# Patient Record
Sex: Male | Born: 1985 | Race: White | Hispanic: No | Marital: Single
Health system: Southern US, Community
[De-identification: ages and names within clinical notes are randomized; demographics above are authoritative.]

## PROBLEM LIST (undated history)

## (undated) DIAGNOSIS — R569 Unspecified convulsions: Secondary | ICD-10-CM

## (undated) DIAGNOSIS — F1910 Other psychoactive substance abuse, uncomplicated: Secondary | ICD-10-CM

---

## 2019-06-23 ENCOUNTER — Encounter: Payer: Managed Care, Other (non HMO) | Attending: Physician Assistant | Admitting: Physician Assistant

## 2019-06-23 ENCOUNTER — Other Ambulatory Visit: Payer: Self-pay

## 2019-06-23 DIAGNOSIS — E1140 Type 2 diabetes mellitus with diabetic neuropathy, unspecified: Secondary | ICD-10-CM | POA: Insufficient documentation

## 2019-06-23 DIAGNOSIS — I10 Essential (primary) hypertension: Secondary | ICD-10-CM | POA: Diagnosis not present

## 2019-06-23 DIAGNOSIS — Z86718 Personal history of other venous thrombosis and embolism: Secondary | ICD-10-CM | POA: Diagnosis not present

## 2019-06-23 DIAGNOSIS — L97522 Non-pressure chronic ulcer of other part of left foot with fat layer exposed: Secondary | ICD-10-CM | POA: Diagnosis not present

## 2019-06-23 DIAGNOSIS — E11621 Type 2 diabetes mellitus with foot ulcer: Secondary | ICD-10-CM | POA: Insufficient documentation

## 2019-06-23 DIAGNOSIS — Z7901 Long term (current) use of anticoagulants: Secondary | ICD-10-CM | POA: Insufficient documentation

## 2019-06-23 DIAGNOSIS — L97512 Non-pressure chronic ulcer of other part of right foot with fat layer exposed: Secondary | ICD-10-CM | POA: Insufficient documentation

## 2019-06-23 DIAGNOSIS — F17200 Nicotine dependence, unspecified, uncomplicated: Secondary | ICD-10-CM | POA: Diagnosis not present

## 2019-06-23 NOTE — Progress Notes (Signed)
Bleiler, ISENBERG ([id number]) Visit Report for 06/23/2019 Abuse/Suicide Risk Screen Details Patient Name: Harold Garcia, Harold Garcia Date of Service: 06/23/2019 2:15 PM Medical Record Number: [id number] Patient Account Number: [id number] Date of Birth/Sex: 1985-03-06 (34 y.o. M) Treating RN: Curtis Sites Primary Care Daisey Caloca: SYSTEM, PCP Other Clinician: Referring Fabian Coca: Elray Buba Treating Irys Nigh/Extender: STONE III, HOYT Weeks in Treatment: 0 Abuse/Suicide Risk Screen Items Answer ABUSE RISK SCREEN: Has anyone close to you tried to hurt or harm you recentlyo No Do you feel uncomfortable with anyone in your familyo No Has anyone forced you do things that you didnot want to doo No Electronic Signature(s) Signed: 06/23/2019 4:19:43 PM By: Curtis Sites Entered By: Curtis Sites on 06/23/2019 14:20:44 Harold Garcia ([id number]) -------------------------------------------------------------------------------- Activities of Daily Living Details Patient Name: Harold Garcia Date of Service: 06/23/2019 2:15 PM Medical Record Number: [id number] Patient Account Number: [id number] Date of Birth/Sex: December 13, 1985 (34 y.o. M) M) Treating RN: Curtis Sites Primary Care Chi Woodham: SYSTEM, PCP Other Clinician: Referring Alec Jaros: Elray Buba Treating Siona Coulston/Extender: STONE III, HOYT Weeks in Treatment: 0 Activities of Daily Living Items Answer Activities of Daily Living (Please select one for each item) Drive Automobile Completely Able Take Medications Completely Able Use Telephone Completely Able Care for Appearance Completely Able Use Toilet Completely Able Bath / Shower Completely Able Dress Self Completely Able Feed Self Completely Able Walk Completely Able Get In / Out Bed Completely Able Housework Completely Able Prepare Meals Completely Able Handle Money Completely Able Shop for Self Completely Able Electronic Signature(s) Signed: 06/23/2019 4:19:43 PM By: Curtis Sites Entered By: Curtis Sites on 06/23/2019 14:21:05 Harold Garcia ([id number]) -------------------------------------------------------------------------------- Education Screening Details Patient Name: Harold Garcia Date of Service: 06/23/2019 2:15 PM Medical Record Number: [id number] Patient Account Number: [id number] Date of Birth/Sex: 17-Jul-1985 (34 y.o. M) Treating RN: Curtis Sites Primary Care Jeralynn Vaquera: SYSTEM, PCP Other Clinician: Referring Dantre Yearwood: Elray Buba Treating Lenville Hibberd/Extender: Linwood Dibbles, HOYT Weeks in Treatment: 0 Primary Learner Assessed: Patient Learning Preferences/Education Level/Primary Language Learning Preference: Explanation, Demonstration Highest Education Level: College or Above Preferred Language: English Cognitive Barrier Language Barrier: No Translator Needed: No Memory Deficit: No Emotional Barrier: No Cultural/Religious Beliefs Affecting Medical Care: No Physical Barrier Impaired Vision: No Impaired Hearing: No Decreased Hand dexterity: No Knowledge/Comprehension Knowledge Level: Medium Comprehension Level: Medium Ability to understand written instructions: Medium Ability to understand verbal instructions: Medium Motivation Anxiety Level: Calm Cooperation: Cooperative Education Importance: Acknowledges Need Interest in Health Problems: Asks Questions Perception: Coherent Willingness to Engage in Self-Management Medium Activities: Readiness to Engage in Self-Management Medium Activities: Electronic Signature(s) Signed: 06/23/2019 4:19:43 PM By: Curtis Sites Entered By: Curtis Sites on 06/23/2019 14:21:25 Harold Garcia ([id number]) -------------------------------------------------------------------------------- Fall Risk Assessment Details Patient Name: Harold Garcia Date of Service: 06/23/2019 2:15 PM Medical Record Number: [id number] Patient Account Number: [id number] Date of Birth/Sex: 01-Jan-1986 (34 y.o.  M) Treating RN: Curtis Sites Primary Care Dawnna Gritz: SYSTEM, PCP Other Clinician: Referring Emalie Mcwethy: Elray Buba Treating Kevron Patella/Extender: STONE III, HOYT Weeks in Treatment: 0 Fall Risk Assessment Items Have you had 2 or more falls in the last 12 monthso 0 No Have you had any fall that resulted in injury in the last 12 monthso 0 No FALLS RISK SCREEN History of falling - immediate or within 3 months 0 No Secondary diagnosis (Do you have 2 or more medical diagnoseso) 0 No Ambulatory aid None/bed rest/wheelchair/nurse 0 Yes Crutches/cane/walker 0 No Furniture 0 No Intravenous therapy Access/Saline/Heparin Lock 0 No Gait/Transferring Normal/ bed rest/ wheelchair 0 Yes Weak (short steps with or without shuffle, stooped but able  to lift head while walking, may 0 No seek support from furniture) Impaired (short steps with shuffle, may have difficulty arising from chair, head down, impaired 0 No balance) Mental Status Oriented to own ability 0 Yes Electronic Signature(s) Signed: 06/23/2019 4:19:43 PM By: Montey Hora Entered By: Montey Hora on 06/23/2019 14:21:36 Harold Garcia ([id number]) -------------------------------------------------------------------------------- Nutrition Risk Screening Details Patient Name: Harold Garcia Date of Service: 06/23/2019 2:15 PM Medical Record Number: [id number] Patient Account Number: [id number] Date of Birth/Sex: 04/29/1985 (34 y.o. M) Treating RN: Montey Hora Primary Care Delayza Lungren: SYSTEM, PCP Other Clinician: Referring Jazlynne Milliner: Murlean Caller Treating Chet Greenley/Extender: STONE III, HOYT Weeks in Treatment: 0 Height (in): 75 Weight (lbs): 245 Body Mass Index (BMI): 30.6 Nutrition Risk Screening Items Score Screening NUTRITION RISK SCREEN: I have an illness or condition that made me change the kind and/or amount of food I eat 0 No I eat fewer than two meals per day 0 No I eat few fruits and vegetables, or milk products 0  No I have three or more drinks of beer, liquor or wine almost every day 0 No I have tooth or mouth problems that make it hard for me to eat 0 No I don't always have enough money to buy the food I need 0 No I eat alone most of the time 0 No I take three or more different prescribed or over-the-counter drugs a day 1 Yes Without wanting to, I have lost or gained 10 pounds in the last six months 0 No I am not always physically able to shop, cook and/or feed myself 0 No Nutrition Protocols Good Risk Protocol 0 No interventions needed Moderate Risk Protocol High Risk Proctocol Risk Level: Good Risk Score: 1 Electronic Signature(s) Signed: 06/23/2019 4:19:43 PM By: Montey Hora Entered By: Montey Hora on 06/23/2019 14:22:06

## 2019-06-23 NOTE — Progress Notes (Signed)
COREN, SAGAN ([id number]) Visit Report for 06/23/2019 Allergy List Details Patient Name: Harold Garcia, Harold Garcia Date of Service: 06/23/2019 2:15 PM Medical Record Number: [id number] Patient Account Number: [id number] Date of Birth/Sex: 05-21-1985 (34 y.o. M) Treating RN: Curtis Sites Primary Care Ronika Kelson: SYSTEM, PCP Other Clinician: Referring Danelly Hassinger: Elray Buba Treating Elany Felix/Extender: STONE III, HOYT Weeks in Treatment: 0 Allergies Active Allergies morphine Reaction: difficulty breathing Allergy Notes Electronic Signature(s) Signed: 06/23/2019 4:19:43 PM By: Curtis Sites Entered By: Curtis Sites on 06/23/2019 14:20:36 Harold Garcia ([id number]) -------------------------------------------------------------------------------- Arrival Information Details Patient Name: Harold Garcia Date of Service: 06/23/2019 2:15 PM Medical Record Number: [id number] Patient Account Number: [id number] Date of Birth/Sex: 06-13-1985 (33 y.o. M) Treating RN: Rodell Perna Primary Care Jacion Dismore: SYSTEM, PCP Other Clinician: Referring Martrice Apt: Elray Buba Treating Arshan Jabs/Extender: Linwood Dibbles, HOYT Weeks in Treatment: 0 Visit Information Patient Arrived: Ambulatory Arrival Time: 14:15 Accompanied By: self Transfer Assistance: None Patient Identification Verified: Yes Secondary Verification Process Completed: Yes Patient Has Alerts: Yes Patient Alerts: Patient on Blood Thinner Xarelto DMII Electronic Signature(s) Signed: 06/23/2019 4:19:43 PM By: Curtis Sites Entered By: Curtis Sites on 06/23/2019 14:23:05 Harold Garcia ([id number]) -------------------------------------------------------------------------------- Clinic Level of Care Assessment Details Patient Name: Harold Garcia Date of Service: 06/23/2019 2:15 PM Medical Record Number: [id number] Patient Account Number: [id number] Date of Birth/Sex: 1985-02-23 (33 y.o. M) Treating RN: Rodell Perna Primary Care Nezar Buckles:  SYSTEM, PCP Other Clinician: Referring Shardea Cwynar: Elray Buba Treating Ryker Pherigo/Extender: STONE III, HOYT Weeks in Treatment: 0 Clinic Level of Care Assessment Items TOOL 2 Quantity Score []  - Use when only an EandM is performed on the INITIAL visit 0 ASSESSMENTS - Nursing Assessment / Reassessment X - General Physical Exam (combine w/ comprehensive assessment (listed just below) when performed on new 1 20 pt. evals) X- 1 25 Comprehensive Assessment (HX, ROS, Risk Assessments, Wounds Hx, etc.) ASSESSMENTS - Wound and Skin Assessment / Reassessment []  - Simple Wound Assessment / Reassessment - one wound 0 X- 2 5 Complex Wound Assessment / Reassessment - multiple wounds []  - 0 Dermatologic / Skin Assessment (not related to wound area) ASSESSMENTS - Ostomy and/or Continence Assessment and Care []  - Incontinence Assessment and Management 0 []  - 0 Ostomy Care Assessment and Management (repouching, etc.) PROCESS - Coordination of Care X - Simple Patient / Family Education for ongoing care 1 15 []  - 0 Complex (extensive) Patient / Family Education for ongoing care X- 1 10 Staff obtains , Records, Test Results / Process Orders []  - 0 Staff telephones HHA, Nursing Homes / Clarify orders / etc []  - 0 Routine Transfer to another Facility (non-emergent condition) []  - 0 Routine Hospital Admission (non-emergent condition) X- 1 15 New Admissions / / Ordering NPWT, Apligraf, etc. []  - 0 Emergency Hospital Admission (emergent condition) X- 1 10 Simple Discharge Coordination []  - 0 Complex (extensive) Discharge Coordination PROCESS - Special Needs []  - Pediatric / Minor Patient Management 0 []  - 0 Isolation Patient Management []  - 0 Hearing / Language / Visual special needs []  - 0 Assessment of Community assistance (transportation, D/C planning, etc.) []  - 0 Additional assistance / Altered mentation []  - 0 Support Surface(s) Assessment (bed,  cushion, seat, etc.) INTERVENTIONS - Wound Cleansing / Measurement X - Wound Imaging (photographs - any number of wounds) 1 5 []  - 0 Wound Tracing (instead of photographs) X- 1 5 Simple Wound Measurement - one wound []  - 0 Complex Wound Measurement - multiple wounds Harold Garcia, Harold Garcia ( ) X- 1 5 Simple Wound Cleansing - one wound []  -  0 Complex Wound Cleansing - multiple wounds INTERVENTIONS - Wound Dressings []  - Small Wound Dressing one or multiple wounds 0 X- 1 15 Medium Wound Dressing one or multiple wounds []  - 0 Large Wound Dressing one or multiple wounds []  - 0 Application of Medications - injection INTERVENTIONS - Miscellaneous []  - External ear exam 0 []  - 0 Specimen Collection (cultures, biopsies, blood, body fluids, etc.) []  - 0 Specimen(s) / Culture(s) sent or taken to Lab for analysis []  - 0 Patient Transfer (multiple staff / / Similar devices) []  - 0 Simple Staple / Suture removal (25 or less) []  - 0 Complex Staple / Suture removal (26 or more) []  - 0 Hypo / Hyperglycemic Management (close monitor of Blood Glucose) []  - 0 Ankle / Brachial Index (ABI) - do not check if billed separately Has the patient been seen at the hospital within the last three years: Yes Total Score: 135 Level Of Care: New/Established - Level 4 Electronic Signature(s) Signed: 06/23/2019 3:08:17 PM By: Entered By: on 06/23/2019 15:02:01 ( ) -------------------------------------------------------------------------------- Encounter Discharge Information Details Patient Name: Nurse, adult Date of Service: 06/23/2019 2:15 PM Medical Record Number: Patient Account Number: Date of Birth/Sex: [birth year] (34 y.o. M) Treating RN: Rodell Perna Primary Care Maren Wiesen: SYSTEM, PCP Other Clinician: Referring Okla Qazi: Rodell Perna Treating Raliyah Montella/Extender: 08/23/2019, HOYT Weeks in Treatment:  0 Encounter Discharge Information Items Discharge Condition: Stable Ambulatory Status: Ambulatory Discharge Destination: Home Transportation: Private Auto Accompanied By: self Schedule Follow-up Appointment: Yes Clinical Summary of Care: Electronic Signature(s) Signed: 06/23/2019 3:08:17 PM By: [id number] Entered By: Harold Garcia on 06/23/2019 15:02:43 [id number] ([id number]) -------------------------------------------------------------------------------- Lower Extremity Assessment Details Patient Name: 11/25/1985 Date of Service: 06/23/2019 2:15 PM Medical Record Number: Rodell Perna Patient Account Number: Elray Buba Date of Birth/Sex: [birth year] (33 y.o. M) Treating RN: Rodell Perna Primary Care Ronnett Pullin: SYSTEM, PCP Other Clinician: Referring Tarisa Paola: Rodell Perna Treating Griselda Bramblett/Extender: STONE III, HOYT Weeks in Treatment: 0 Edema Assessment Assessed: [Left: No] [Right: No] Edema: [Left: No] [Right: No] Calf Left: Right: Point of Measurement: 36 cm From Medial Instep 42 cm 41 cm Ankle Left: Right: Point of Measurement: 12 cm From Medial Instep 23.5 cm 23.5 cm Vascular Assessment Pulses: Dorsalis Pedis Palpable: [Left:Yes] [Right:Yes] Doppler Audible: [Left:Yes] [Right:Yes] Posterior Tibial Palpable: [Left:Yes] [Right:Yes] Doppler Audible: [Left:Yes] [Right:Yes] Blood Pressure: Brachial: [Left:144] Dorsalis Pedis: 138 [Left:Dorsalis Pedis: 140] Ankle: Posterior Tibial: 152 [Left:Posterior Tibial: 150 1.06] [Right:1.04] Electronic Signature(s) Signed: 06/23/2019 4:19:43 PM By: Harold Garcia Entered By: [id number] on 06/23/2019 14:43:59 08/23/2019 ([id number]) -------------------------------------------------------------------------------- Multi Wound Chart Details Patient Name: [id number] Date of Service: 06/23/2019 2:15 PM Medical Record Number: [id number] Patient Account Number: Curtis Sites Date of Birth/Sex: 05-02-1985 (34 y.o.  M) Treating RN: Curtis Sites Primary Care Kristi Norment: SYSTEM, PCP Other Clinician: Referring Rianne Degraaf: Curtis Sites Treating Mari Battaglia/Extender: STONE III, HOYT Weeks in Treatment: 0 Vital Signs Height(in): 75 Pulse(bpm): 103 Weight(lbs): 245 Blood Pressure(mmHg): 144/78 Body Mass Index(BMI): 31 Temperature(F): 98.7 Respiratory Rate(breaths/min): 16 Photos: [N/A:N/A] Wound Location: Right Toe Second Left Toe Great N/A Wounding Event: Trauma Trauma N/A Primary Etiology: Diabetic Wound/Ulcer of the Lower Diabetic Wound/Ulcer of the Lower N/A Extremity Extremity Comorbid History: Deep Vein Thrombosis, Deep Vein Thrombosis, N/A Hypertension, Cirrhosis , Type II Hypertension, Cirrhosis , Type II Diabetes, Neuropathy Diabetes, Neuropathy Date Acquired: 05/19/2019 05/19/2019 N/A Weeks of Treatment: 0 0 N/A Wound Status: Open Open N/A Measurements L x W x D (cm) 0.2x0.5x0.1 0.6x1.7x0.1 N/A Area (cm) :  0.079 0.801 N/A Volume (cm) : 0.008 0.08 N/A Classification: Grade 1 Grade 1 N/A Exudate Amount: Medium Medium N/A Exudate Type: Serous Sanguinous N/A Exudate Color: amber red N/A Wound Margin: Flat and Intact Flat and Intact N/A Granulation Amount: Large (67-100%) Large (67-100%) N/A Granulation Quality: Pink Red N/A Necrotic Amount: Small (1-33%) None Present (0%) N/A Exposed Structures: Fat Layer (Subcutaneous Tissue) Fat Layer (Subcutaneous Tissue) N/A Exposed: Yes Exposed: Yes Fascia: No Fascia: No Tendon: No Tendon: No Muscle: No Muscle: No Joint: No Joint: No Bone: No Bone: No Epithelialization: None None N/A Treatment Notes Electronic Signature(s) Signed: 06/23/2019 3:08:17 PM By: Army Melia Entered By: Army Melia on 06/23/2019 15:00:02 Harold Garcia ([id number]) -------------------------------------------------------------------------------- Multi-Disciplinary Care Plan Details Patient Name: Harold Garcia Date of Service: 06/23/2019 2:15 PM Medical  Record Number: [id number] Patient Account Number: [id number] Date of Birth/Sex: Sep 27, 1985 (34 y.o. M) Treating RN: Army Melia Primary Care Cyril Woodmansee: SYSTEM, PCP Other Clinician: Referring Satya Bohall: Murlean Caller Treating Ashvik Grundman/Extender: STONE III, HOYT Weeks in Treatment: 0 Active Inactive Orientation to the Wound Care Program Nursing Diagnoses: Knowledge deficit related to the wound healing center program Goals: Patient/caregiver will verbalize understanding of the [place] Program Date Initiated: 06/23/2019 Target Resolution Date: 07/22/2019 Goal Status: Active Interventions: Provide education on orientation to the wound center Notes: Wound/Skin Impairment Nursing Diagnoses: Impaired tissue integrity Goals: Ulcer/skin breakdown will have a volume reduction of 30% by week 4 Date Initiated: 06/23/2019 Target Resolution Date: 07/20/2019 Goal Status: Active Interventions: Assess ulceration(s) every visit Notes: Electronic Signature(s) Signed: 06/23/2019 3:08:17 PM By: Army Melia Entered By: Army Melia on 06/23/2019 14:58:08 Harold Garcia ([id number]) -------------------------------------------------------------------------------- Pain Assessment Details Patient Name: Harold Garcia Date of Service: 06/23/2019 2:15 PM Medical Record Number: [id number] Patient Account Number: [id number] Date of Birth/Sex: 04-Jan-1986 (34 y.o. M) Treating RN: Army Melia Primary Care Shawn Carattini: SYSTEM, PCP Other Clinician: Referring Alexanderia Gorby: Murlean Caller Treating Zola Runion/Extender: STONE III, HOYT Weeks in Treatment: 0 Active Problems Location of Pain Severity and Description of Pain Patient Has Paino Yes Site Locations Rate the pain. Current Pain Level: 4 Pain Management and Medication Current Pain Management: Electronic Signature(s) Signed: 06/23/2019 3:08:17 PM By: Army Melia Signed: 06/23/2019 4:30:59 PM By: Lorine Bears RCP, RRT, CHT Entered By:  Lorine Bears on 06/23/2019 [place], Giordan ([id number]) -------------------------------------------------------------------------------- Patient/Caregiver Education Details Patient Name: Harold Garcia Date of Service: 06/23/2019 2:15 PM Medical Record Number: [id number] Patient Account Number: [id number] Date of Birth/Gender: [birth year] (33 y.o. M) Treating RN: Army Melia Primary Care Physician: SYSTEM, PCP Other Clinician: Referring Physician: Murlean Caller Treating Physician/Extender: Melburn Hake, HOYT Weeks in Treatment: 0 Education Assessment Education Provided To: Patient Education Topics Provided Wound/Skin Impairment: Handouts: Caring for Your Ulcer Methods: Demonstration, Explain/Verbal Responses: State content correctly Electronic Signature(s) Signed: 06/23/2019 3:08:17 PM By: Army Melia Entered By: Army Melia on 06/23/2019 15:02:11 Harold Garcia ([id number]) -------------------------------------------------------------------------------- Wound Assessment Details Patient Name: Harold Garcia Date of Service: 06/23/2019 2:15 PM Medical Record Number: [id number] Patient Account Number: [id number] Date of Birth/Sex: May 27, 1985 (33 y.o. M) Treating RN: Montey Hora Primary Care Mekayla Soman: SYSTEM, PCP Other Clinician: Referring Fanta Wimberley: Murlean Caller Treating Davine Coba/Extender: STONE III, HOYT Weeks in Treatment: 0 Wound Status Wound Number: 1 Primary Diabetic Wound/Ulcer of the Lower Extremity Etiology: Wound Location: Right Toe Second Wound Status: Open Wounding Event: Trauma Comorbid Deep Vein Thrombosis, Hypertension, Cirrhosis , Type II Date Acquired: 05/19/2019 History: Diabetes, Neuropathy Weeks Of Treatment: 0 Clustered Wound: No Photos Wound Measurements Length: (cm) 0.2 % Reduc Width: (cm) 0.5 %  Reduc Depth: (cm) 0.1 Epithel Area: (cm) 0.079 Tunnel Volume: (cm) 0.008 Underm tion in Area: tion in Volume: ialization:  None ing: No ining: No Wound Description Classification: Grade 1 Foul Od Wound Margin: Flat and Intact Slough/ Exudate Amount: Medium Exudate Type: Serous Exudate Color: amber or After Cleansing: No Fibrino Yes Wound Bed Granulation Amount: Large (67-100%) Exposed Structure Granulation Quality: Pink Fascia Exposed: No Necrotic Amount: Small (1-33%) Fat Layer (Subcutaneous Tissue) Exposed: Yes Necrotic Quality: Adherent Slough Tendon Exposed: No Muscle Exposed: No Joint Exposed: No Bone Exposed: No Treatment Notes Wound #1 (Right Toe Second) Notes xerform, coverlet Electronic Signature(s) Signed: 06/23/2019 4:19:43 PM By: Marcha Solders, Arlys John ([id number]) Entered By: Curtis Sites on 06/23/2019 14:34:48 Harold Garcia ([id number]) -------------------------------------------------------------------------------- Wound Assessment Details Patient Name: Harold Garcia Date of Service: 06/23/2019 2:15 PM Medical Record Number: [id number] Patient Account Number: [id number] Date of Birth/Sex: 1985/12/07 (34 y.o. M) Treating RN: Curtis Sites Primary Care Jeff Frieden: SYSTEM, PCP Other Clinician: Referring Malikah Lakey: Elray Buba Treating Hannah Crill/Extender: STONE III, HOYT Weeks in Treatment: 0 Wound Status Wound Number: 2 Primary Diabetic Wound/Ulcer of the Lower Extremity Etiology: Wound Location: Left Toe Great Wound Status: Open Wounding Event: Trauma Comorbid Deep Vein Thrombosis, Hypertension, Cirrhosis , Type II Date Acquired: 05/19/2019 History: Diabetes, Neuropathy Weeks Of Treatment: 0 Clustered Wound: No Photos Wound Measurements Length: (cm) 0.6 % R Width: (cm) 1.7 % R Depth: (cm) 0.1 Epi Area: (cm) 0.801 Tu Volume: (cm) 0.08 Un eduction in Area: eduction in Volume: thelialization: None nneling: No dermining: No Wound Description Classification: Grade 1 Fou Wound Margin: Flat and Intact Slo Exudate Amount: Medium Exudate Type:  Sanguinous Exudate Color: red l Odor After Cleansing: No ugh/Fibrino No Wound Bed Granulation Amount: Large (67-100%) Exposed Structure Granulation Quality: Red Fascia Exposed: No Necrotic Amount: None Present (0%) Fat Layer (Subcutaneous Tissue) Exposed: Yes Tendon Exposed: No Muscle Exposed: No Joint Exposed: No Bone Exposed: No Treatment Notes Wound #2 (Left Toe Great) Notes xerform, coverlet Electronic Signature(s) Signed: 06/23/2019 4:19:43 PM By: Marcha Solders, Arlys John ([id number]) Entered By: Curtis Sites on 06/23/2019 14:36:10 Harold Garcia ([id number]) -------------------------------------------------------------------------------- Vitals Details Patient Name: Harold Garcia Date of Service: 06/23/2019 2:15 PM Medical Record Number: [id number] Patient Account Number: [id number] Date of Birth/Sex: March 24, 1985 (34 y.o. M) Treating RN: Rodell Perna Primary Care Antwione Picotte: SYSTEM, PCP Other Clinician: Referring Zahriyah Joo: Elray Buba Treating Xavyer Steenson/Extender: STONE III, HOYT Weeks in Treatment: 0 Vital Signs Time Taken: 14:10 Temperature (F): 98.7 Height (in): 75 Pulse (bpm): 103 Source: Stated Respiratory Rate (breaths/min): 16 Weight (lbs): 245 Blood Pressure (mmHg): 144/78 Source: Measured Reference Range: 80 - 120 mg / dl Body Mass Index (BMI): 30.6 Electronic Signature(s) Signed: 06/23/2019 4:30:59 PM By: Dayton Martes RCP, RRT, CHT Entered By: Dayton Martes on 06/23/2019 14:16:54

## 2019-06-24 NOTE — Progress Notes (Signed)
SUMIT, BRANHAM ([id number]) Visit Report for 06/23/2019 Chief Complaint Document Details Patient Name: Harold Garcia, Harold Garcia Date of Service: 06/23/2019 2:15 PM Medical Record Number: [id number] Patient Account Number: [id number] Date of Birth/Sex: [birth year] (34 y.o. M) Treating RN: Rodell Perna Primary Care Provider: SYSTEM, PCP Other Clinician: Referring Provider: Elray Buba Treating Provider/Extender: Linwood Dibbles, Najai Waszak Weeks in Treatment: 0 Information Obtained from: Patient Chief Complaint Bilateral foot uclers Electronic Signature(s) Signed: 06/23/2019 2:53:54 PM By: Lenda Kelp PA-C Entered By: Lenda Kelp on 06/23/2019 14:53:54 Harold Garcia ([id number]) -------------------------------------------------------------------------------- HPI Details Patient Name: Harold Garcia Date of Service: 06/23/2019 2:15 PM Medical Record Number: [id number] Patient Account Number: [id number] Date of Birth/Sex: 11/02/1985 (33 y.o. M) Treating RN: Rodell Perna Primary Care Provider: SYSTEM, PCP Other Clinician: Referring Provider: Elray Buba Treating Provider/Extender: Linwood Dibbles, Terilynn Buresh Weeks in Treatment: 0 History of Present Illness HPI Description: 06/23/2019 upon evaluation today patient actually presents for initial inspection here in clinic concerning injuries that he has to his right second toe and left great toe. With that being said it appears in both cases that these were some type of injury. Fortunately there is no signs of infection at this time but nonetheless I do think we need to be very cautious of watching out for any evidence of this worsening into an infection. He has excellent blood flow and at this point the only thing really going badly for him is the fact that he is diabetic and does have a history of alcohol and drug abuse. He is currently at [facility]. Apparently he was or rather is from [place] and he was recently admitted for definitive treatment of his  alcohol, benzodiazepine, and opiate use disorders. Fortunately he seems to be doing well. He states that he is actually gained 10 pounds and that the food is "actually really good". He is a self-employed Administrator according to the note. He did have an hemoglobin A1c checked on 06/20/2019 and this was 8.6 although I do not have the actual report to show this it was performed at [facility] they would have the original report however it was not in the notes that we received for his appointment today. 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 He tells me that he pulled his toenails off as they were just "hanging there". Electronic Signature(s) Signed: 06/23/2019 3:09:46 PM By: Lenda Kelp PA-C Entered By: Lenda Kelp on 06/23/2019 15:09:46 MONTAVIS, SCHUBRING ([id number]) -------------------------------------------------------------------------------- Physical Exam Details Patient Name: Harold Garcia Date of Service: 06/23/2019 2:15 PM Medical Record Number: [id number] Patient Account Number: [id number] Date of Birth/Sex: 1986-01-10 (33 y.o. M) Treating RN: Rodell Perna Primary Care Provider: SYSTEM, PCP Other Clinician: Referring Provider: Elray Buba Treating Provider/Extender: STONE III, Roi Jafari Weeks in Treatment: 0 Constitutional patient is hypertensive.. pulse regular and within target range for patient.Marland Kitchen respirations regular, non-labored and within target range for patient.Marland Kitchen temperature within target range for patient.. Well-nourished and well-hydrated in no acute distress. Eyes conjunctiva clear no eyelid edema noted. pupils equal round and reactive to light and accommodation. Ears, Nose, Mouth, and Throat no gross abnormality of ear auricles or external auditory canals. normal hearing noted during conversation. mucus membranes moist. Respiratory normal breathing without  difficulty. Cardiovascular no clubbing, cyanosis, significant edema, <3 sec cap refill. Musculoskeletal normal gait and posture. no significant deformity or arthritic changes, no loss or range of motion, no clubbing. Psychiatric this patient is able  to make decisions and demonstrates good insight into disease process. Alert and Oriented x 3. pleasant and cooperative. Notes Upon inspection patient's wounds actually appear to be very minimal on the right second toe just below the toenail distally. This is almost completely healed. In regard to the left great toe this does appear to be a little bit more involved with regard to the nailbed where he pulled off the toenail but in general that is the only thing I really see going on at this point. There does not appear to be any signs of infection here and I really think is just going to take time for this to reepithelialize. He may grow toenail back at some point but that may take some time as well. Electronic Signature(s) Signed: 06/23/2019 3:10:29 PM By: Lenda Kelp PA-C Entered By: Lenda Kelp on 06/23/2019 15:10:29 Harold Garcia ([id number]) -------------------------------------------------------------------------------- Physician Orders Details Patient Name: Harold Garcia Date of Service: 06/23/2019 2:15 PM Medical Record Number: [id number] Patient Account Number: [id number] Date of Birth/Sex: 1985-09-19 (33 y.o. 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 o Cleanse wound with mild soap and water Wound #2 Left Toe Great o Clean wound with Normal Saline. o Cleanse wound with mild soap and water Primary Wound Dressing Wound #1 Right Toe Second o Xeroform Wound #2 Left Toe Great o Xeroform Secondary Dressing Wound #1 Right Toe Second o Other - coverlet Wound #2 Left Toe Great o Other - coverlet Dressing Change Frequency Wound #1 Right Toe Second o Change dressing every other day. Wound #2 Left Toe Great o Change dressing every other day. Follow-up Appointments Wound #1 Right Toe Second o Return Appointment in 1 week. Wound #2 Left Toe Great o Return Appointment in 1 week. Electronic Signature(s) Signed: 06/23/2019 3:08:17 PM By: Rodell Perna Signed: 06/23/2019 4:59:38 PM By: Audria Nine, Racer ([id number]) Entered By: Rodell Perna on 06/23/2019 15:01:27 MYRAN, ARCIA ([id number]) -------------------------------------------------------------------------------- Problem List Details Patient Name: Harold Garcia Date of Service: 06/23/2019 2:15 PM Medical Record Number: [id number] Patient Account Number: [id number] Date of Birth/Sex: [birth year] (34 y.o. M) Treating RN: Rodell Perna Primary Care Provider: SYSTEM, PCP Other Clinician: Referring Provider: Elray Buba Treating Provider/Extender: Linwood Dibbles, Sheliah Fiorillo Weeks in Treatment: 0 Active Problems ICD-10 Encounter Code Description Active Date MDM Diagnosis E11.621 Type 2 diabetes mellitus with foot ulcer 06/23/2019 No Yes L97.522 Non-pressure chronic ulcer of other part of left foot with fat layer 06/23/2019 No Yes exposed L97.512 Non-pressure chronic ulcer of other part of right foot with fat layer 06/23/2019 No Yes exposed I10  Essential (primary) hypertension 06/23/2019 No Yes E11.40 Type 2 diabetes mellitus with diabetic neuropathy, unspecified 06/23/2019 No Yes 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 M) Treating RN: Rodell Perna Primary Care Provider: SYSTEM, PCP Other Clinician: Referring Provider: Elray Buba Treating Provider/Extender: Linwood Dibbles, Blanche Scovell Weeks in Treatment: 0 Subjective Chief Complaint Information obtained from Patient Bilateral foot uclers History of Present Illness (HPI) 06/23/2019 upon evaluation today patient actually presents for initial inspection here in clinic concerning injuries that he has to his right second toe and left great toe. With that being said it appears in both cases that these were some type of injury. Fortunately there is no signs of infection at this time but nonetheless I do think we need to be very cautious of watching out for any evidence of this worsening into an infection. He has excellent blood flow and at this point the only thing really going badly for him is the fact that he is diabetic and does have a history of alcohol and drug abuse. He is currently at [facility]. 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 Fortunately he seems to be doing well. He states that he is actually gained 10 pounds and that the food is "actually  really good". He is a self-employed Administrator according to the note. He did have an hemoglobin A1c checked on 06/20/2019 and this was 8.6 although I do not have the actual report to show this it was performed at [facility] they would have the original report however it was not in the notes that we received for his appointment today. 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 He tells me that he pulled his toenails off as they were just "hanging there". Patient History Information obtained from Patient. Allergies morphine (Reaction: difficulty breathing) Family History Diabetes - Father, Hypertension - Maternal Grandparents, Stroke - Maternal Grandparents, No family history of Cancer, Heart Disease, Hereditary Spherocytosis, Kidney Disease, Lung Disease, Seizures, Thyroid Problems, Tuberculosis. Social History Current every day smoker, Marital Status - Single, Alcohol Use - Never - history, Drug Use - No History - history, Caffeine Use - Never. Medical History Cardiovascular Patient has history of Deep Vein Thrombosis - with PE, Hypertension Denies history of Angina, Arrhythmia, Congestive Heart Failure, Coronary Artery Disease, Hypotension, Myocardial Infarction, Peripheral Arterial Disease, Peripheral Venous Disease, Phlebitis, Vasculitis Gastrointestinal Patient has history of Cirrhosis - ETOH and NASH Denies history of Colitis, Crohn s, Hepatitis A, Hepatitis B, Hepatitis C Endocrine Patient has history of Type II Diabetes Denies history of Type I Diabetes Immunological Denies history of Lupus Erythematosus, Raynaud s, Scleroderma Integumentary (Skin) Denies history of History of Burn, History of pressure wounds Neurologic Patient has history of Neuropathy Denies history of Dementia, Quadriplegia, Paraplegia, Seizure Disorder Patient is treated with Insulin, Oral Agents. Medical And Surgical  History Notes Immunological Klinefelter's syndrome Psychiatric PTSD Review of Systems (ROS) Constitutional Symptoms (General Health) Denies complaints or symptoms of Fatigue, Fever, Chills, Marked Weight Change. Eyes Denies complaints or symptoms of Dry Eyes, Vision Changes, Glasses / Contacts. Ear/Nose/Mouth/Throat Denies complaints or symptoms of Difficult clearing ears, Sinusitis. KEYLON, LABELLE ([id number]) Hematologic/Lymphatic Denies complaints or symptoms of Bleeding / Clotting Disorders, Human Immunodeficiency Virus. Respiratory Denies complaints or symptoms of Chronic or frequent coughs, Shortness of Breath. Cardiovascular Denies complaints  or symptoms of Chest pain, LE edema. Gastrointestinal Denies complaints or symptoms of Frequent diarrhea, Nausea, Vomiting. Endocrine Denies complaints or symptoms of Hepatitis, Thyroid disease, Polydypsia (Excessive Thirst). Genitourinary Denies complaints or symptoms of Kidney failure/ Dialysis, Incontinence/dribbling. Immunological Denies complaints or symptoms of Hives, Itching. Integumentary (Skin) Complains or has symptoms of Wounds. Denies complaints or symptoms of Bleeding or bruising tendency, Breakdown, Swelling. Musculoskeletal Denies complaints or symptoms of Muscle Pain, Muscle Weakness. Neurologic Denies complaints or symptoms of Numbness/parasthesias, Focal/Weakness. Psychiatric Complains or has symptoms of Anxiety. Denies complaints or symptoms of Claustrophobia. Objective Constitutional patient is hypertensive.. pulse regular and within target range for patient.Marland Kitchen respirations regular, non-labored and within target range for patient.Marland Kitchen temperature within target range for patient.. Well-nourished and well-hydrated in no acute distress. Vitals Time Taken: 2:10 PM, Height: 75 in, Source: Stated, Weight: 245 lbs, Source: Measured, BMI: 30.6, Temperature: 98.7 F, Pulse: 103 bpm, Respiratory Rate: 16 breaths/min,  Blood Pressure: 144/78 mmHg. Eyes conjunctiva clear no eyelid edema noted. pupils equal round and reactive to light and accommodation. Ears, Nose, Mouth, and Throat no gross abnormality of ear auricles or external auditory canals. normal hearing noted during conversation. mucus membranes moist. Respiratory normal breathing without difficulty. Cardiovascular no clubbing, cyanosis, significant edema, Musculoskeletal normal gait and posture. no significant deformity or arthritic changes, no loss or range of motion, no clubbing. Psychiatric this patient is able to make decisions and demonstrates good insight into disease process. Alert and Oriented x 3. pleasant and cooperative. General Notes: Upon inspection patient's wounds actually appear to be very minimal on the right second toe just below the toenail distally. This is almost completely healed. In regard to the left great toe this does appear to be a little bit more involved with regard to the nailbed where he pulled off the toenail but in general that is the only thing I really see going on at this point. There does not appear to be any signs of infection here and I really think is just going to take time for this to reepithelialize. He may grow toenail back at some point but that may take some time as well. Integumentary (Hair, Skin) Wound #1 status is Open. Original cause of wound was Trauma. The wound is located on the Right Toe Second. The wound measures 0.2cm length x 0.5cm width x 0.1cm depth; 0.079cm^2 area and 0.008cm^3 volume. There is Fat Layer (Subcutaneous Tissue) Exposed exposed. There is no tunneling or undermining noted. There is a medium amount of serous drainage noted. The wound margin is flat and intact. There is large (67-100%) pink granulation within the wound bed. There is a small (1-33%) amount of necrotic tissue within the wound bed including Adherent Slough. Wound #2 status is Open. Original cause of wound was  Trauma. The wound is located on the Left Toe Great. The wound measures 0.6cm length x 1.7cm width x 0.1cm depth; 0.801cm^2 area and 0.08cm^3 volume. There is Fat Layer (Subcutaneous Tissue) Exposed exposed. There is no tunneling or undermining noted. There is a medium amount of sanguinous drainage noted. The wound margin is flat and intact. There is large Shakoor, Jevon ([id number]) (67-100%) red granulation within the wound bed. There is no necrotic tissue within the wound bed. 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 Cleanse wound with mild soap and water Wound #2 Left Toe Great: Clean wound with Normal Saline. Cleanse wound with mild soap and water Primary Wound Dressing: Wound #1 Right Toe Second: Xeroform Wound #2 Left Toe Great: Xeroform Secondary Dressing: Wound #1 Right Toe Second: Other - coverlet Wound #2 Left Toe Great: Other - coverlet Dressing Change Frequency: Wound #1 Right Toe Second: Change dressing every other day. Wound #2 Left Toe Great: Change dressing every other day. Follow-up Appointments: Wound #1 Right Toe Second: Return Appointment in 1 week. Wound #2 Left Toe Great: Return Appointment in 1 week. 1. At this time my suggestion is good be that we initiate treatment with Xeroform gauze to both toenails/wound locations. He is in agreement with the plan. 2. I am also can recommend we continue to monitor for infection I will see him next week in that regard will cover this with a coverlet/Band-Aid at this point. 3. I am also can  recommend that the patient clean the area with mild soap and water during the dressing change times. 4. With regard to the alcohol, opiate, and benzodiazepine abuse he is [facility] he tells me and then after that he will decide where he is going as far as long-term treatment is concerned from there. We will see patient back for reevaluation in 1 week here in the clinic. If anything worsens or changes patient will contact our office for additional recommendations. Electronic Signature(s) Signed: 06/23/2019 3:11:29 PM By: Lenda KelpStone III, Aryssa Rosamond PA-C Entered By: Lenda KelpStone III, Tiran Sauseda on 06/23/2019 15:11:28 Harold SalmonsHUTCHINSON, Kamar ([id number]) -------------------------------------------------------------------------------- ROS/PFSH Details Patient Name: Harold SalmonsHUTCHINSON, Harold Garcia Date of Service: 06/23/2019 2:15 PM Medical Record Number: [id number] Patient Account Number: [id number] Date of Birth/Sex: 07/26/1985 (33 y.o. M) Treating RN: Curtis Sitesorthy, Joanna Primary Care Provider: SYSTEM, PCP Other Clinician: Referring Provider: Elray BubaWASHO, MICHAEL Treating Provider/Extender: STONE III, Christina Gintz Weeks in Treatment: 0 Information Obtained From Patient Constitutional Symptoms (General Health) Complaints and Symptoms: Negative for: Fatigue; Fever; Chills; Marked Weight Change Eyes Complaints and Symptoms: Negative for: Dry Eyes; Vision Changes; Glasses / Contacts Ear/Nose/Mouth/Throat Complaints and Symptoms: Negative for: Difficult clearing ears; Sinusitis Hematologic/Lymphatic Complaints and Symptoms: Negative for: Bleeding / Clotting Disorders; Human Immunodeficiency Virus Respiratory Complaints and Symptoms: Negative for: Chronic or frequent coughs; Shortness of Breath Cardiovascular Complaints and Symptoms: Negative for: Chest pain; LE edema Medical History: Positive for: Deep Vein Thrombosis - with PE; Hypertension Negative for: Angina; Arrhythmia; Congestive Heart Failure; Coronary  Artery Disease; Hypotension; Myocardial Infarction; Peripheral Arterial Disease; Peripheral Venous Disease; Phlebitis; Vasculitis Gastrointestinal Complaints and Symptoms: Negative for: Frequent diarrhea; Nausea; Vomiting Medical History: Positive for: Cirrhosis - ETOH and NASH Negative for: Colitis; Crohnos; Hepatitis A; Hepatitis B; Hepatitis C Endocrine Complaints and Symptoms: Negative for: Hepatitis; Thyroid disease; Polydypsia (Excessive Thirst) Medical History: Positive for: Type II Diabetes Negative for: Type I Diabetes Treated with: Insulin, Oral agents Genitourinary Harold Garcia, Harold Garcia ([id number]) Complaints and Symptoms: Negative for: Kidney failure/ Dialysis; Incontinence/dribbling Immunological Complaints and Symptoms: Negative for: Hives; Itching Medical History: Negative for: Lupus Erythematosus; Raynaudos; Scleroderma Past Medical History Notes: Klinefelter's syndrome Integumentary (Skin) Complaints and Symptoms: Positive for: Wounds Negative for: Bleeding or bruising tendency; Breakdown; Swelling Medical History: Negative for: History of Burn; History of pressure wounds Musculoskeletal Complaints and Symptoms: Negative for: Muscle Pain; Muscle Weakness Neurologic Complaints and Symptoms: Negative for: Numbness/parasthesias;  Focal/Weakness Medical History: Positive for: Neuropathy Negative for: Dementia; Quadriplegia; Paraplegia; Seizure Disorder Psychiatric Complaints and Symptoms: Positive for: Anxiety Negative for: Claustrophobia Medical History: Past Medical History Notes: PTSD Oncologic Immunizations Pneumococcal Vaccine: Received Pneumococcal Vaccination: No Implantable Devices None Family and Social History Cancer: No; Diabetes: Yes - Father; Heart Disease: No; Hereditary Spherocytosis: No; Hypertension: Yes - Maternal Grandparents; Kidney Disease: No; Lung Disease: No; Seizures: No; Stroke: Yes - Maternal Grandparents; Thyroid Problems:  No; Tuberculosis: No; Current every day smoker; Marital Status - Single; Alcohol Use: Never - history; Drug Use: No History - history; Caffeine Use: Never; Financial Concerns: No; Food, Clothing or Shelter Needs: No; Support System Lacking: No; Transportation Concerns: No Electronic Signature(s) Signed: 06/23/2019 4:19:43 PM By: Montey Hora Signed: 06/23/2019 4:59:38 PM By: Worthy Keeler PA-C Entered By: Montey Hora on 06/23/2019 14:27:19 Juline Patch ([id number]) -------------------------------------------------------------------------------- SuperBill Details Patient Name: Juline Patch Date of Service: 06/23/2019 Medical Record Number: [id number] Patient Account Number: [id number] Date of Birth/Sex: [birth year] (34 y.o. M) Treating RN: Army Melia Primary Care Provider: SYSTEM, PCP Other Clinician: Referring Provider: Murlean Caller Treating Provider/Extender: Melburn Hake, Yorel Redder Weeks in Treatment: 0 Diagnosis Coding ICD-10 Codes Code Description E11.621 Type 2 diabetes mellitus with foot ulcer L97.522 Non-pressure chronic ulcer of other part of left foot with fat layer exposed L97.512 Non-pressure chronic ulcer of other part of right foot with fat layer exposed I10 Essential (primary) hypertension E11.40 Type 2 diabetes mellitus with diabetic neuropathy, unspecified F10.11 Alcohol abuse, in remission F11.11 Opioid abuse, in remission Facility Procedures CPT4 Code: 60630160 Description: 99214 - WOUND CARE VISIT-LEV 4 EST PT Modifier: Quantity: 1 Physician Procedures CPT4 Code: 1093235 Description: WC PHYS LEVEL 3 o NEW PT Modifier: Quantity: 1 CPT4 Code: Description: ICD-10 Diagnosis Description E11.621 Type 2 diabetes mellitus with foot ulcer L97.522 Non-pressure chronic ulcer of other part of left foot with fat layer L97.512 Non-pressure chronic ulcer of other part of right foot with fat laye I10  Essential (primary) hypertension Modifier: exposed r  exposed Quantity: Electronic Signature(s) Signed: 06/23/2019 3:11:52 PM By: Worthy Keeler PA-C Entered By: Worthy Keeler on 06/23/2019 15:11:51

## 2019-06-30 ENCOUNTER — Other Ambulatory Visit: Payer: Self-pay

## 2019-06-30 ENCOUNTER — Encounter: Payer: Managed Care, Other (non HMO) | Admitting: Internal Medicine

## 2019-06-30 DIAGNOSIS — E11621 Type 2 diabetes mellitus with foot ulcer: Secondary | ICD-10-CM | POA: Diagnosis not present

## 2019-07-01 NOTE — Progress Notes (Signed)
[name], [name] ([id number]) Visit Report for 06/30/2019 HPI Details Patient Name: Harold Garcia, Harold Garcia Date of Service: 06/30/2019 11:15 AM Medical Record Number: [id number] Patient Account Number: [id number] Date of Birth/Sex: August 27, 1985 (34 y.o. M) Treating RN: Rodell Perna Primary Care Provider: SYSTEM, PCP Other Clinician: Referring Provider: Elray Buba Treating Provider/Extender: Altamese [place] in Treatment: 1 History of Present Illness HPI Description: 06/23/2019 upon evaluation today patient actually presents for initial inspection here in clinic concerning injuries that he has to his right second toe and left great toe. With that being said it appears in both cases that these were some type of injury. Fortunately there is no signs of infection at this time but nonetheless I do think we need to be very cautious of watching out for any evidence of this worsening into an infection. He has excellent blood flow and at this point the only thing really going badly for him is the fact that he is diabetic and does have a history of alcohol and drug abuse. He is currently at [facility]. 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 Fortunately he seems to be doing well. He states that he is actually gained 10 pounds and that the food is "actually really good". He is a self-employed Administrator according to the note. He did have an hemoglobin A1c checked on 06/20/2019 and this was 8.6 although I do not have the actual report to show this it was performed at [facility] they would have the original report however it was not in the notes that we received for his appointment today. 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 He tells me that he pulled his toenails off as they were just "hanging  there". 5/13; wounds in the nailbed of the left first and right second toes have no open area here today. He is a diabetic I find it puzzling why his nails came off in the first place. I am unable to get a description of tinea. He is unsure how this happened. In any case he does not have an open area in the base of the nailbed anywhere that I can see. Electronic Signature(s) Signed: 06/30/2019 5:28:47 PM By: Baltazar Najjar MD Entered By: Baltazar Najjar on 06/30/2019 11:52:59 Harold Garcia ([id number]) -------------------------------------------------------------------------------- Physical Exam Details Patient Name: Harold Garcia Date of Service: 06/30/2019 11:15 AM Medical Record Number: [id number] Patient Account Number: [id number] Date of Birth/Sex: July 18, 1985 (33 y.o. M) Treating RN: Rodell Perna Primary Care Provider: SYSTEM, PCP Other Clinician: Referring Provider: Elray Buba Treating Provider/Extender: Maxwell Caul [place] in Treatment: 1 Constitutional Sitting or standing Blood Pressure is within target range for patient.. Pulse regular and within target range for patient.Marland Kitchen Respirations regular, non- labored and within target range.. Temperature is normal and within the target range for the patient.Marland Kitchen appears in no distress. Cardiovascular Pedal pulses are palpable. Notes Wound exam; I do not see anything open on the right first or second toes nailbed areas or the left first. All of these seem to have lost the toenails. There does not seem to be any evidence of an infection or a skin condition in the nailbeds that would explain this. The rest of his nails on toes 3-5 bilaterally look normal. Electronic Signature(s) Signed: 06/30/2019 5:28:47 PM By: Baltazar Najjar MD Entered By: Baltazar Najjar on 06/30/2019  11:54:56 Harold Garcia, Harold Garcia ([id number]) -------------------------------------------------------------------------------- Physician Orders Details Patient Name:  Harold Garcia, Harold Garcia Date of Service: 06/30/2019 11:15 AM Medical Record Number: [id number] Patient Account Number: [id number] Date of Birth/Sex: 1985-07-15 (34 y.o. 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 M) Treating RN: Army Melia Primary Care Provider: SYSTEM, PCP Other Clinician: Referring Provider: Murlean Caller Treating Provider/Extender: Tito Dine in Treatment: 1 Active Problems ICD-10 Encounter Code Description Active Date MDM Diagnosis E11.621 Type 2 diabetes mellitus with foot ulcer 06/23/2019 No Yes L97.522 Non-pressure chronic ulcer of other part of left foot with fat layer 06/23/2019 No Yes exposed L97.512 Non-pressure chronic ulcer of other part of right foot with fat layer 06/23/2019 No  Yes exposed Dix (primary) hypertension 06/23/2019 No Yes E11.40 Type 2 diabetes mellitus with diabetic neuropathy, unspecified 06/23/2019 No Yes 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 M) Treating RN: Army Melia Primary Care Provider: SYSTEM, PCP Other Clinician: Referring Provider: Murlean Caller Treating Provider/Extender: Tito Dine in Treatment: 1 Subjective History of Present Illness (HPI) 06/23/2019 upon evaluation today patient actually presents for initial inspection here in clinic concerning injuries that he has to his right second toe and left great toe. With that being said it appears in both cases that these were some type of injury. Fortunately there is no signs of infection at this time but nonetheless I do think we need to be very cautious of watching out for any evidence of this worsening into an infection. He has excellent blood flow and at this point the only thing really going badly for him is the fact that he is diabetic and does have a history of alcohol and drug abuse. He is currently at [facility]. 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 Fortunately he seems to be doing well. He states that he is actually gained 10 pounds and that the food is "actually really good". He is a self-employed Development worker, international aid according  to the note. He did have an hemoglobin A1c checked on 06/20/2019 and this was 8.6 although I do not have the actual report to show this it was performed at [facility] they  would have the original report however it was not in the notes that we received for his appointment today. 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 He tells me that he pulled his toenails off as they were just "hanging there". 5/13; wounds in the nailbed of the left first and right second toes have no open area here today. He is a diabetic I find it puzzling why his nails came off in the first place. I am unable to get a description of tinea. He is unsure how this happened. In any case he does not have an open area in the base of the nailbed anywhere that I can see. Objective Constitutional Sitting or standing Blood Pressure is within target range for patient.. Pulse regular and within target range for patient.Marland Kitchen Respirations regular, non- labored and within target range.. Temperature is normal and within the target range for the patient.Marland Kitchen appears in no distress. Vitals Time Taken: 11:10 AM, Height: 75 in, Weight: 245 lbs, BMI: 30.6, Temperature: 98.4 F, Pulse: 113 bpm, Respiratory Rate: 16 breaths/min, Blood Pressure: 136/76 mmHg. Cardiovascular Pedal pulses are palpable. General Notes: Wound exam; I do not see anything open on the right first or second toes nailbed areas or the left first. All of these seem to have lost the toenails. There does not seem to be any evidence of an infection or a skin condition in the nailbeds that would explain this. The rest of his nails on toes 3-5 bilaterally look normal. Integumentary (Hair, Skin) Wound #1 status is Healed - Epithelialized. Original cause of wound was Trauma. The wound is located on the Right Toe Second. The wound measures 0cm length x 0cm width x 0cm depth; 0cm^2 area and 0cm^3 volume.  There is Fat Layer (Subcutaneous Tissue) Exposed exposed. There is no tunneling or undermining noted. There is a none present amount of drainage noted. The wound margin is flat and intact. There is large (67-100%) pink granulation within the wound bed. There is no necrotic tissue within the wound bed. Wound #2 status is Healed - Epithelialized. Original cause of wound was Trauma. The wound is located on the Left Toe Great. The wound measures 0cm length x 0cm width x 0cm depth; 0cm^2 area and 0cm^3 volume. There is Fat Layer (Subcutaneous Tissue) Exposed exposed. There is no tunneling or undermining noted. There is a none present amount of drainage noted. The wound margin is flat and intact. There is large (67-100%) red granulation within the wound bed. There is no necrotic tissue within the wound bed. 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 The patient can be discharged from the wound care center 2. I have advised topical antibiotics and Band-Aids to the area change daily 3. No open wound is seen Electronic Signature(s) Signed: 06/30/2019 5:28:47 PM By: Baltazar Najjar MD Entered By: Baltazar Najjar on 06/30/2019 11:55:33 Harold Garcia ([id number]) -------------------------------------------------------------------------------- SuperBill Details Patient Name: Harold Garcia Date of Service: 06/30/2019 Medical Record Number: [id number] Patient Account Number: [id number] Date of Birth/Sex: [birth year] (33 y.o. 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, in remission Facility Procedures CPT4 Code: [id number] Description: 99213 - WOUND CARE VISIT-LEV 3 EST PT Modifier: Quantity: 1 Physician Procedures CPT4 Code: [id number] Description: 41740 - WC PHYS LEVEL 2 - EST PT Modifier: Quantity: 1 CPT4 Code: Description: ICD-10 Diagnosis Description E11.621 Type 2 diabetes mellitus with foot ulcer L97.522 Non-pressure chronic ulcer of other part of left foot with fat layer ex L97.512 Non-pressure chronic ulcer of other part of right foot with fat layer e Modifier: posed xposed Quantity: Electronic Signature(s) Signed: 06/30/2019 5:28:47 PM By: Baltazar Najjar MD Entered By: Baltazar Najjar on 06/30/2019 11:55:50

## 2019-07-01 NOTE — Progress Notes (Signed)
MICA, RELEFORD ([id number]) Visit Report for 06/30/2019 Arrival Information Details Patient Name: Harold Garcia, Harold Garcia Date of Service: 06/30/2019 11:15 AM Medical Record Number: [id number] Patient Account Number: [id number] Date of Birth/Sex: 24-Oct-1985 (34 y.o. M) Treating RN: Rodell Perna Primary Care Breleigh Carpino: SYSTEM, PCP Other Clinician: Referring Umaima Scholten: Elray Buba Treating Aleni Andrus/Extender: Altamese [place] in Treatment: 1 Visit Information History Since Last Visit Added or deleted any medications: No Patient Arrived: Ambulatory Any new allergies or adverse reactions: No Arrival Time: 11:09 Had a fall or experienced change in No Accompanied By: caregiver activities of daily living that may affect Transfer Assistance: None risk of falls: Patient Identification Verified: Yes Signs or symptoms of abuse/neglect since last visito No Secondary Verification Process Completed: Yes Hospitalized since last visit: No Patient Has Alerts: Yes Implantable device outside of the clinic excluding No Patient Alerts: Patient on Blood Thinner cellular tissue based products placed in the center Xarelto since last visit: DMII Has Dressing in Place as Prescribed: Yes Pain Present Now: No Electronic Signature(s) Signed: 06/30/2019 4:38:04 PM By: Dayton Martes RCP, RRT, CHT Entered By: Dayton Martes on 06/30/2019 11:10:14 Harold Garcia ([id number]) -------------------------------------------------------------------------------- Clinic Level of Care Assessment Details Patient Name: Harold Garcia, Harold Garcia Date of Service: 06/30/2019 11:15 AM Medical Record Number: [id number] Patient Account Number: [id number] Date of Birth/Sex: 04-Sep-1985 (34 y.o. M) Treating RN: Rodell Perna Primary Care Genecis Veley: SYSTEM, PCP Other Clinician: Referring Darrill Vreeland: Elray Buba Treating Evon Dejarnett/Extender: Altamese [place] in Treatment: 1 Clinic Level of Care  Assessment Items TOOL 4 Quantity Score []  - Use when only an EandM is performed on FOLLOW-UP visit 0 ASSESSMENTS - Nursing Assessment / Reassessment X - Reassessment of Co-morbidities (includes updates in patient status) 1 10 X- 1 5 Reassessment of Adherence to Treatment Plan ASSESSMENTS - Wound and Skin Assessment / Reassessment X - Simple Wound Assessment / Reassessment - one wound 1 5 []  - 0 Complex Wound Assessment / Reassessment - multiple wounds []  - 0 Dermatologic / Skin Assessment (not related to wound area) ASSESSMENTS - Focused Assessment X - Circumferential Edema Measurements - multi extremities 1 5 []  - 0 Nutritional Assessment / Counseling / Intervention X- 1 5 Lower Extremity Assessment (monofilament, tuning fork, pulses) []  - 0 Peripheral Arterial Disease Assessment (using hand held doppler) ASSESSMENTS - Ostomy and/or Continence Assessment and Care []  - Incontinence Assessment and Management 0 []  - 0 Ostomy Care Assessment and Management (repouching, etc.) PROCESS - Coordination of Care X - Simple Patient / Family Education for ongoing care 1 15 []  - 0 Complex (extensive) Patient / Family Education for ongoing care X- 1 10 Staff obtains , Records, Test Results / Process Orders []  - 0 Staff telephones HHA, Nursing Homes / Clarify orders / etc []  - 0 Routine Transfer to another Facility (non-emergent condition) []  - 0 Routine Hospital Admission (non-emergent condition) []  - 0 New Admissions / / Ordering NPWT, Apligraf, etc. []  - 0 Emergency Hospital Admission (emergent condition) X- 1 10 Simple Discharge Coordination []  - 0 Complex (extensive) Discharge Coordination PROCESS - Special Needs []  - Pediatric / Minor Patient Management 0 []  - 0 Isolation Patient Management []  - 0 Hearing / Language / Visual special needs []  - 0 Assessment of Community assistance (transportation, D/C planning, etc.) []  - 0 Additional  assistance / Altered mentation []  - 0 Support Surface(s) Assessment (bed, cushion, seat, etc.) INTERVENTIONS - Wound Cleansing / Measurement Harold Garcia, Harold Garcia ( ) []  - 0 Simple Wound Cleansing - one wound X- 1  5 Complex Wound Cleansing - multiple wounds X- 1 5 Wound Imaging (photographs - any number of wounds) []  - 0 Wound Tracing (instead of photographs) X- 1 5 Simple Wound Measurement - one wound []  - 0 Complex Wound Measurement - multiple wounds INTERVENTIONS - Wound Dressings []  - Small Wound Dressing one or multiple wounds 0 []  - 0 Medium Wound Dressing one or multiple wounds []  - 0 Large Wound Dressing one or multiple wounds []  - 0 Application of Medications - topical []  - 0 Application of Medications - injection INTERVENTIONS - Miscellaneous []  - External ear exam 0 []  - 0 Specimen Collection (cultures, biopsies, blood, body fluids, etc.) []  - 0 Specimen(s) / Culture(s) sent or taken to Lab for analysis []  - 0 Patient Transfer (multiple staff / Civil Service fast streamer / Similar devices) []  - 0 Simple Staple / Suture removal (25 or less) []  - 0 Complex Staple / Suture removal (26 or more) []  - 0 Hypo / Hyperglycemic Management (close monitor of Blood Glucose) []  - 0 Ankle / Brachial Index (ABI) - do not check if billed separately X- 1 5 Vital Signs Has the patient been seen at the hospital within the last three years: Yes Total Score: 85 Level Of Care: New/Established - Level 3 Electronic Signature(s) Signed: 06/30/2019 4:45:22 PM By: Army Melia Entered By: Army Melia on 06/30/2019 11:51:08 Harold Garcia ([id number]) -------------------------------------------------------------------------------- Encounter Discharge Information Details Patient Name: Harold Garcia Date of Service: 06/30/2019 11:15 AM Medical Record Number: [id number] Patient Account Number: [id number] Date of Birth/Sex: 08-03-1985 (34 y.o. M) Treating RN: Army Melia Primary Care  Jeryn Cerney: SYSTEM, PCP Other Clinician: Referring Lakaya Tolen: Murlean Caller Treating Kyriana Yankee/Extender: Tito Dine in Treatment: 1 Encounter Discharge Information Items Discharge Condition: Stable Ambulatory Status: Ambulatory Discharge Destination: Home Transportation: Private Auto Accompanied By: self Schedule Follow-up Appointment: Yes Clinical Summary of Care: Electronic Signature(s) Signed: 06/30/2019 11:51:43 AM By: Army Melia Entered By: Army Melia on 06/30/2019 11:51:43 Harold Garcia ([id number]) -------------------------------------------------------------------------------- Lower Extremity Assessment Details Patient Name: Harold Garcia Date of Service: 06/30/2019 11:15 AM Medical Record Number: [id number] Patient Account Number: [id number] Date of Birth/Sex: 1985-04-08 (33 y.o. M) Treating RN: Montey Hora Primary Care Tylisha Danis: SYSTEM, PCP Other Clinician: Referring Azani Brogdon: Murlean Caller Treating Angie Hogg/Extender: Ricard Dillon Weeks in Treatment: 1 Edema Assessment Assessed: [Left: No] [Right: No] Edema: [Left: No] [Right: No] Vascular Assessment Pulses: Dorsalis Pedis Palpable: [Left:Yes] [Right:Yes] Electronic Signature(s) Signed: 06/30/2019 5:03:46 PM By: Montey Hora Entered By: Montey Hora on 06/30/2019 11:26:44 Harold Garcia ([id number]) -------------------------------------------------------------------------------- Multi Wound Chart Details Patient Name: Harold Garcia Date of Service: 06/30/2019 11:15 AM Medical Record Number: [id number] Patient Account Number: [id number] Date of Birth/Sex: 11-Jul-1985 (34 y.o. M) Treating RN: Army Melia Primary Care Takako Minckler: SYSTEM, PCP Other Clinician: Referring Elden Brucato: Murlean Caller Treating Maryetta Shafer/Extender: Tito Dine in Treatment: 1 Vital Signs Height(in): 75 Pulse(bpm): 113 Weight(lbs): 245 Blood Pressure(mmHg): 136/76 Body Mass Index(BMI):  31 Temperature(F): 98.4 Respiratory Rate(breaths/min): 16 Photos: [N/A:N/A] Wound Location: Right Toe Second Left Toe Great N/A Wounding Event: Trauma Trauma N/A Primary Etiology: Diabetic Wound/Ulcer of the Lower Diabetic Wound/Ulcer of the Lower N/A Extremity Extremity Comorbid History: Deep Vein Thrombosis, Deep Vein Thrombosis, N/A Hypertension, Cirrhosis , Type II Hypertension, Cirrhosis , Type II Diabetes, Neuropathy Diabetes, Neuropathy Date Acquired: 05/19/2019 05/19/2019 N/A Weeks of Treatment: 1 1 N/A Wound Status: Open Open N/A Measurements L x W x D (cm) 0.1x0.1x0.1 0.1x0.1x0.1 N/A Area (cm) : 0.008 0.008 N/A Volume (cm) : 0.001 0.001 N/A % Reduction  in Area: 89.90% 99.00% N/A % Reduction in Volume: 87.50% 98.80% N/A Classification: Grade 1 Grade 1 N/A Exudate Amount: None Present None Present N/A Wound Margin: Flat and Intact Flat and Intact N/A Granulation Amount: Large (67-100%) Large (67-100%) N/A Granulation Quality: Pink Red N/A Necrotic Amount: None Present (0%) None Present (0%) N/A Exposed Structures: Fat Layer (Subcutaneous Tissue) Fat Layer (Subcutaneous Tissue) N/A Exposed: Yes Exposed: Yes Fascia: No Fascia: No Tendon: No Tendon: No Muscle: No Muscle: No Joint: No Joint: No Bone: No Bone: No Epithelialization: None None N/A Treatment Notes Electronic Signature(s) Signed: 06/30/2019 5:28:47 PM By: Baltazar Najjar MD Entered By: Baltazar Najjar on 06/30/2019 11:47:37 Harold Garcia ([id number]) -------------------------------------------------------------------------------- Multi-Disciplinary Care Plan Details Patient Name: Harold Garcia Date of Service: 06/30/2019 11:15 AM Medical Record Number: [id number] Patient Account Number: [id number] Date of Birth/Sex: 03-10-1985 (33 y.o. M) Treating RN: Rodell Perna Primary Care Shaunee Mulkern: SYSTEM, PCP Other Clinician: Referring Mckell Riecke: Elray Buba Treating Jariah Jarmon/Extender: Altamese [place] in Treatment: 1 Active Inactive Electronic Signature(s) Signed: 06/30/2019 11:49:29 AM By: Rodell Perna Entered By: Rodell Perna on 06/30/2019 11:49:28 Harold Garcia ([id number]) -------------------------------------------------------------------------------- Pain Assessment Details Patient Name: Harold Garcia Date of Service: 06/30/2019 11:15 AM Medical Record Number: [id number] Patient Account Number: [id number] Date of Birth/Sex: February 24, 1985 (34 y.o. M) Treating RN: Curtis Sites Primary Care Balian Schaller: SYSTEM, PCP Other Clinician: Referring Adalyna Godbee: Elray Buba Treating Kloie Whiting/Extender: Altamese [place] in Treatment: 1 Active Problems Location of Pain Severity and Description of Pain Patient Has Paino No Site Locations Pain Management and Medication Current Pain Management: Electronic Signature(s) Signed: 06/30/2019 5:03:46 PM By: Curtis Sites Entered By: Curtis Sites on 06/30/2019 11:21:08 Harold Garcia ([id number]) -------------------------------------------------------------------------------- Patient/Caregiver Education Details Patient Name: Harold Garcia Date of Service: 06/30/2019 11:15 AM Medical Record Number: [id number] Patient Account Number: [id number] Date of Birth/Gender: [birth year] (34 y.o. M) Treating RN: Rodell Perna Primary Care Physician: SYSTEM, PCP Other Clinician: Referring Physician: Elray Buba Treating Physician/Extender: Altamese [place] in Treatment: 1 Education Assessment Education Provided To: Patient Education Topics Provided Wound/Skin Impairment: Handouts: Caring for Your Ulcer Methods: Demonstration, Explain/Verbal Responses: State content correctly Electronic Signature(s) Signed: 06/30/2019 4:45:22 PM By: Rodell Perna Entered By: Rodell Perna on 06/30/2019 11:51:23 Harold Garcia ([id number]) -------------------------------------------------------------------------------- Wound Assessment  Details Patient Name: Harold Garcia Date of Service: 06/30/2019 11:15 AM Medical Record Number: [id number] Patient Account Number: [id number] Date of Birth/Sex: Jun 13, 1985 (33 y.o. M) Treating RN: Rodell Perna Primary Care Makenzye Troutman: SYSTEM, PCP Other Clinician: Referring Marlyce Mcdougald: Elray Buba Treating Brysun Eschmann/Extender: Altamese  in Treatment: 1 Wound Status Wound Number: 1 Primary Diabetic Wound/Ulcer of the Lower Extremity Etiology: Wound Location: Right Toe Second Wound Status: Healed - Epithelialized Wounding Event: Trauma Comorbid Deep Vein Thrombosis, Hypertension, Cirrhosis , Type II Date Acquired: 05/19/2019 History: Diabetes, Neuropathy Weeks Of Treatment: 1 Clustered Wound: No Photos Wound Measurements Length: (cm) 0 Width: (cm) 0 Depth: (cm) 0 Area: (cm) Volume: (cm) % Reduction in Area: 100% % Reduction in Volume: 100% Epithelialization: None 0 Tunneling: No 0 Undermining: No Wound Description Classification: Grade 1 Wound Margin: Flat and Intact Exudate Amount: None Present Foul Odor After Cleansing: No Slough/Fibrino No Wound Bed Granulation Amount: Large (67-100%) Exposed Structure Granulation Quality: Pink Fascia Exposed: No Necrotic Amount: None Present (0%) Fat Layer (Subcutaneous Tissue) Exposed: Yes Tendon Exposed: No Muscle Exposed: No Joint Exposed: No Bone Exposed: No Electronic Signature(s) Signed: 06/30/2019 4:45:22 PM By: Rodell Perna Entered By: Rodell Perna on 06/30/2019 11:47:43 Harold Garcia, Harold Garcia ([id number]) -------------------------------------------------------------------------------- Wound Assessment Details Patient  Name: Harold Garcia, Harold Garcia Date of Service: 06/30/2019 11:15 AM Medical Record Number: [id number] Patient Account Number: [id number] Date of Birth/Sex: 11/26/1985 (34 y.o. M) Treating RN: Rodell Perna Primary Care Yocelin Vanlue: SYSTEM, PCP Other Clinician: Referring Jonessa Triplett: Elray Buba Treating  Mertice Uffelman/Extender: Altamese [place] in Treatment: 1 Wound Status Wound Number: 2 Primary Diabetic Wound/Ulcer of the Lower Extremity Etiology: Wound Location: Left Toe Great Wound Status: Healed - Epithelialized Wounding Event: Trauma Comorbid Deep Vein Thrombosis, Hypertension, Cirrhosis , Type II Date Acquired: 05/19/2019 History: Diabetes, Neuropathy Weeks Of Treatment: 1 Clustered Wound: No Photos Wound Measurements Length: (cm) Width: (cm) Depth: (cm) Area: (cm) Volume: (cm) 0 % Reduction in Area: 100% 0 % Reduction in Volume: 100% 0 Epithelialization: None 0 Tunneling: No 0 Undermining: No Wound Description Classification: Grade 1 Wound Margin: Flat and Intact Exudate Amount: None Present Foul Odor After Cleansing: No Slough/Fibrino No Wound Bed Granulation Amount: Large (67-100%) Exposed Structure Granulation Quality: Red Fascia Exposed: No Necrotic Amount: None Present (0%) Fat Layer (Subcutaneous Tissue) Exposed: Yes Tendon Exposed: No Muscle Exposed: No Joint Exposed: No Bone Exposed: No Electronic Signature(s) Signed: 06/30/2019 4:45:22 PM By: Rodell Perna Entered By: Rodell Perna on 06/30/2019 11:47:44 Harold Garcia ([id number]) -------------------------------------------------------------------------------- Vitals Details Patient Name: Harold Garcia Date of Service: 06/30/2019 11:15 AM Medical Record Number: [id number] Patient Account Number: [id number] Date of Birth/Sex: 1985-03-09 (34 y.o. M) Treating RN: Rodell Perna Primary Care Kisa Fujii: SYSTEM, PCP Other Clinician: Referring Hollis Oh: Elray Buba Treating Jordynn Perrier/Extender: Altamese [place] in Treatment: 1 Vital Signs Time Taken: 11:10 Temperature (F): 98.4 Height (in): 75 Pulse (bpm): 113 Weight (lbs): 245 Respiratory Rate (breaths/min): 16 Body Mass Index (BMI): 30.6 Blood Pressure (mmHg): 136/76 Reference Range: 80 - 120 mg / dl Electronic  Signature(s) Signed: 06/30/2019 4:38:04 PM By: Dayton Martes RCP, RRT, CHT Entered By: Dayton Martes on 06/30/2019 11:11:12

## 2019-07-14 ENCOUNTER — Other Ambulatory Visit: Payer: Self-pay

## 2019-07-14 ENCOUNTER — Emergency Department (HOSPITAL_COMMUNITY)
Admission: EM | Admit: 2019-07-14 | Discharge: 2019-07-18 | Disposition: A | Discharge status: Other Acute Inpatient Hospital | Payer: Managed Care, Other (non HMO) | Attending: Emergency Medicine | Admitting: Emergency Medicine

## 2019-07-14 ENCOUNTER — Encounter (HOSPITAL_COMMUNITY): Payer: Self-pay | Admitting: Emergency Medicine

## 2019-07-14 ENCOUNTER — Emergency Department (HOSPITAL_COMMUNITY): Payer: Managed Care, Other (non HMO)

## 2019-07-14 DIAGNOSIS — T1491XA Suicide attempt, initial encounter: Secondary | ICD-10-CM | POA: Diagnosis not present

## 2019-07-14 DIAGNOSIS — F419 Anxiety disorder, unspecified: Secondary | ICD-10-CM | POA: Diagnosis not present

## 2019-07-14 DIAGNOSIS — Y999 Unspecified external cause status: Secondary | ICD-10-CM | POA: Insufficient documentation

## 2019-07-14 DIAGNOSIS — Z20822 Contact with and (suspected) exposure to covid-19: Secondary | ICD-10-CM | POA: Diagnosis not present

## 2019-07-14 DIAGNOSIS — F1010 Alcohol abuse, uncomplicated: Secondary | ICD-10-CM | POA: Diagnosis not present

## 2019-07-14 DIAGNOSIS — S81811A Laceration without foreign body, right lower leg, initial encounter: Secondary | ICD-10-CM | POA: Diagnosis not present

## 2019-07-14 DIAGNOSIS — Y9389 Activity, other specified: Secondary | ICD-10-CM | POA: Diagnosis not present

## 2019-07-14 DIAGNOSIS — X781XXA Intentional self-harm by knife, initial encounter: Secondary | ICD-10-CM | POA: Insufficient documentation

## 2019-07-14 DIAGNOSIS — Y929 Unspecified place or not applicable: Secondary | ICD-10-CM | POA: Insufficient documentation

## 2019-07-14 DIAGNOSIS — S51812A Laceration without foreign body of left forearm, initial encounter: Secondary | ICD-10-CM | POA: Insufficient documentation

## 2019-07-14 DIAGNOSIS — F321 Major depressive disorder, single episode, moderate: Secondary | ICD-10-CM | POA: Insufficient documentation

## 2019-07-14 DIAGNOSIS — R45851 Suicidal ideations: Secondary | ICD-10-CM | POA: Diagnosis not present

## 2019-07-14 DIAGNOSIS — S51811A Laceration without foreign body of right forearm, initial encounter: Secondary | ICD-10-CM | POA: Diagnosis not present

## 2019-07-14 HISTORY — DX: Other psychoactive substance abuse, uncomplicated: F19.10

## 2019-07-14 HISTORY — DX: Unspecified convulsions: R56.9

## 2019-07-14 LAB — CBC WITH DIFFERENTIAL/PLATELET
Abs Immature Granulocytes: 0.08 10*3/uL — ABNORMAL HIGH (ref 0.00–0.07)
Basophils Absolute: 0 10*3/uL (ref 0.0–0.1)
Basophils Relative: 0 %
Eosinophils Absolute: 0.1 10*3/uL (ref 0.0–0.5)
Eosinophils Relative: 1 %
HCT: 56.8 % — ABNORMAL HIGH (ref 39.0–52.0)
Hemoglobin: 19.4 g/dL — ABNORMAL HIGH (ref 13.0–17.0)
Immature Granulocytes: 1 %
Lymphocytes Relative: 22 %
Lymphs Abs: 1.9 10*3/uL (ref 0.7–4.0)
MCH: 29.2 pg (ref 26.0–34.0)
MCHC: 34.2 g/dL (ref 30.0–36.0)
MCV: 85.5 fL (ref 80.0–100.0)
Monocytes Absolute: 0.6 10*3/uL (ref 0.1–1.0)
Monocytes Relative: 7 %
Neutro Abs: 5.9 10*3/uL (ref 1.7–7.7)
Neutrophils Relative %: 69 %
Platelets: 226 10*3/uL (ref 150–400)
RBC: 6.64 MIL/uL — ABNORMAL HIGH (ref 4.22–5.81)
RDW: 17.2 % — ABNORMAL HIGH (ref 11.5–15.5)
WBC: 8.6 10*3/uL (ref 4.0–10.5)
nRBC: 0 % (ref 0.0–0.2)

## 2019-07-14 LAB — CBG MONITORING, ED: Glucose-Capillary: 223 mg/dL — ABNORMAL HIGH (ref 70–99)

## 2019-07-14 LAB — COMPREHENSIVE METABOLIC PANEL
ALT: 68 U/L — ABNORMAL HIGH (ref 0–44)
AST: 41 U/L (ref 15–41)
Albumin: 4.3 g/dL (ref 3.5–5.0)
Alkaline Phosphatase: 57 U/L (ref 38–126)
Anion gap: 14 (ref 5–15)
BUN: 12 mg/dL (ref 6–20)
CO2: 28 mmol/L (ref 22–32)
Calcium: 9.4 mg/dL (ref 8.9–10.3)
Chloride: 100 mmol/L (ref 98–111)
Creatinine, Ser: 1.17 mg/dL (ref 0.61–1.24)
GFR calc Af Amer: >60 mL/min (ref >60)
GFR calc non Af Amer: >60 mL/min (ref >60)
Glucose, Bld: 184 mg/dL — ABNORMAL HIGH (ref 70–99)
Potassium: 3.6 mmol/L (ref 3.5–5.1)
Sodium: 142 mmol/L (ref 135–145)
Total Bilirubin: 0.9 mg/dL (ref 0.3–1.2)
Total Protein: 7.4 g/dL (ref 6.5–8.1)

## 2019-07-14 LAB — RAPID URINE DRUG SCREEN, HOSP PERFORMED
Amphetamines: NOT DETECTED
Barbiturates: NOT DETECTED
Benzodiazepines: NOT DETECTED
Cocaine: NOT DETECTED
Opiates: NOT DETECTED
Tetrahydrocannabinol: NOT DETECTED

## 2019-07-14 LAB — SARS CORONAVIRUS 2 BY RT PCR (HOSPITAL ORDER, PERFORMED IN ~~LOC~~ HOSPITAL LAB): SARS Coronavirus 2: NEGATIVE

## 2019-07-14 LAB — TROPONIN I (HIGH SENSITIVITY)
Troponin I (High Sensitivity): 14 ng/L (ref <18)
Troponin I (High Sensitivity): 15 ng/L (ref <18)

## 2019-07-14 LAB — ETHANOL: Alcohol, Ethyl (B): <10 mg/dL (ref <10)

## 2019-07-14 MED ORDER — LIDOCAINE-EPINEPHRINE 1 %-1:100000 IJ SOLN: 10.0000 mL | Freq: Once | INTRAMUSCULAR | Status: DC

## 2019-07-14 MED ORDER — LIDOCAINE-EPINEPHRINE (PF) 2 %-1:200000 IJ SOLN
INTRAMUSCULAR | Status: AC
  Filled 2019-07-14: qty 20

## 2019-07-14 MED ORDER — LIDOCAINE-EPINEPHRINE 1 %-1:100000 IJ SOLN
10.0000 mL | Freq: Once | INTRAMUSCULAR | Status: DC
  Filled 2019-07-14 (×2): qty 10

## 2019-07-14 NOTE — ED Notes (Addendum)
Pt provided sprite per request, made aware of need for urine sample

## 2019-07-14 NOTE — ED Notes (Signed)
Copy of IVC faxed to [facility], remainder of copies in pt folder

## 2019-07-14 NOTE — ED Provider Notes (Signed)
[facility]-EMERGENCY DEPT [facility] Emergency Department Provider Note MRN:  [id number]  Arrival date & time: 07/14/19     Chief Complaint   Suicide Attempt   History of Present Illness   [facility] is a 34 y.o. year-old male with a history of seizures, substance abuse presenting to the ED with chief complaint of suicide attempt.  Patient tried to kill himself earlier today with lacerations to his bilateral wrists and left leg.  Does not want to explain his issues again, but summarizes it by saying that he is having relationship issues with his father and his sister.  Endorsing mild pain to the lacerated areas, denies any drugs or alcohol or any other forms of self-harm.  Review of Systems  A complete 10 system review of systems was obtained and all systems are negative except as noted in the HPI and PMH.   Patient's Health History    Past Medical History:  Diagnosis Date  . Seizures (HCC)   . Substance abuse (HCC)       No family history on file.  Social History   Socioeconomic History  . Marital status: Single    Spouse name: Not on file  . Number of children: Not on file  . Years of education: Not on file  . Highest education level: Not on file  Occupational History  . Not on file  Tobacco Use  . Smoking status: Not on file  Substance and Sexual Activity  . Alcohol use: Not on file  . Drug use: Not on file  . Sexual activity: Not on file  Other Topics Concern  . Not on file  Social History Narrative  . Not on file   Social Determinants of Health   Financial Resource Strain:   . Difficulty of Paying Living Expenses:   Food Insecurity:   . Worried About Programme researcher, broadcasting/film/video in the Last Year:   . Barista in the Last Year:   Transportation Needs:   . Freight forwarder (Medical):   Marland Kitchen Lack of Transportation (Non-Medical):   Physical Activity:   . Days of Exercise per Week:   . Minutes of Exercise per Session:   Stress:   . Feeling of  Stress :   Social Connections:   . Frequency of Communication with Friends and Family:   . Frequency of Social Gatherings with Friends and Family:   . Attends Religious Services:   . Active Member of Clubs or Organizations:   . Attends Banker Meetings:   Marland Kitchen Marital Status:   Intimate Partner Violence:   . Fear of Current or Ex-Partner:   . Emotionally Abused:   Marland Kitchen Physically Abused:   . Sexually Abused:      Physical Exam   Vitals:   07/14/19 1658 07/14/19 1703  BP: 133/76   Pulse: (!) 109   Resp: 15   Temp: 98.7 F (37.1 C)   SpO2: 97% 98%    CONSTITUTIONAL: Well-appearing, NAD NEURO:  Alert and oriented x 3, no focal deficits EYES:  eyes equal and reactive ENT/NECK:  no LAD, no JVD CARDIO: Regular rate, well-perfused, normal S1 and S2 PULM:  CTAB no wheezing or rhonchi GI/GU:  normal bowel sounds, non-distended, non-tender MSK/SPINE:  No gross deformities, no edema SKIN:  no rash, superficial lacerations to the medial aspect of the right ankle, anterior aspects of the bilateral wrists PSYCH:  Appropriate speech and behavior  *Additional and/or pertinent findings included in MDM below  Diagnostic and Interventional Summary    EKG Interpretation  Date/Time:  Thursday Jul 14 2019 16:55:07 EDT Ventricular Rate:  109 PR Interval:    QRS Duration: 94 QT Interval:  312 QTC Calculation: 421 R Axis:   125 Text Interpretation: Sinus tachycardia Right atrial enlargement RVH with secondary repolarization abnrm with strain pattern Confirmed by Kennis Carina [id number]) on 07/14/2019 7:41:17 PM      Labs Reviewed  COMPREHENSIVE METABOLIC PANEL - Abnormal; Notable for the following components:      Result Value   Glucose, Bld 184 (*)    ALT 68 (*)    All other components within normal limits  CBC WITH DIFFERENTIAL/PLATELET - Abnormal; Notable for the following components:   RBC 6.64 (*)    Hemoglobin 19.4 (*)    HCT 56.8 (*)    RDW 17.2 (*)    Abs Immature  Granulocytes 0.08 (*)    All other components within normal limits  CBG MONITORING, ED - Abnormal; Notable for the following components:   Glucose-Capillary 223 (*)    All other components within normal limits  SARS CORONAVIRUS 2 BY RT PCR (HOSPITAL ORDER, PERFORMED IN [facility])  ETHANOL  RAPID URINE DRUG SCREEN, HOSP PERFORMED  TROPONIN I (HIGH SENSITIVITY)  TROPONIN I (HIGH SENSITIVITY)    DG Forearm Left  Final Result    DG Forearm Right  Final Result      Medications  lidocaine-EPINEPHrine (XYLOCAINE W/EPI) 1 %-1:100000 (with pres) injection 10 mL (10 mLs Other Not Given 07/14/19 1920)  lidocaine-EPINEPHrine (XYLOCAINE W/EPI) 2 %-1:200000 (PF) injection (  Given 07/14/19 1921)     Procedures  /  Critical Care .Marland KitchenLaceration Repair  Date/Time: 07/14/2019 7:55 PM Performed by: Sabas Sous, MD Authorized by: Sabas Sous, MD   Consent:    Consent obtained:  Verbal   Consent given by:  Patient   Risks discussed:  Infection, need for additional repair, nerve damage, poor wound healing, poor cosmetic result, pain, retained foreign body, tendon damage and vascular damage   Alternatives discussed:  No treatment Anesthesia (see MAR for exact dosages):    Anesthesia method:  None Laceration details:    Location: Right forearm.   Length (cm):  2   Depth (mm):  1 Repair type:    Repair type:  Simple Pre-procedure details:    Preparation:  Patient was prepped and draped in usual sterile fashion Exploration:    Hemostasis achieved with:  Direct pressure   Wound exploration: wound explored through full range of motion and entire depth of wound probed and visualized   Treatment:    Area cleansed with:  Saline   Amount of cleaning:  Standard Skin repair:    Repair method:  Tissue adhesive Approximation:    Approximation:  Close Post-procedure details:    Dressing:  Open (no dressing)   Patient tolerance of procedure:  Tolerated well, no immediate  complications .Marland KitchenLaceration Repair  Date/Time: 07/14/2019 7:56 PM Performed by: Sabas Sous, MD Authorized by: Sabas Sous, MD   Consent:    Consent obtained:  Verbal   Consent given by:  Patient   Risks discussed:  Infection, need for additional repair, nerve damage, poor wound healing, poor cosmetic result, pain, retained foreign body, tendon damage and vascular damage   Alternatives discussed:  No treatment Laceration details:    Location: Right lower leg.   Length (cm):  2   Depth (mm):  1 Repair type:    Repair  type:  Simple Pre-procedure details:    Preparation:  Patient was prepped and draped in usual sterile fashion Exploration:    Hemostasis achieved with:  Direct pressure   Wound exploration: wound explored through full range of motion and entire depth of wound probed and visualized     Contaminated: no   Treatment:    Area cleansed with:  Saline   Amount of cleaning:  Standard Skin repair:    Repair method:  Tissue adhesive Approximation:    Approximation:  Close Post-procedure details:    Dressing:  Open (no dressing)   Patient tolerance of procedure:  Tolerated well, no immediate complications .Marland KitchenLaceration Repair  Date/Time: 07/14/2019 7:57 PM Performed by: Maudie Flakes, MD Authorized by: Maudie Flakes, MD   Consent:    Consent obtained:  Verbal   Consent given by:  Patient   Risks discussed:  Need for additional repair, nerve damage, infection, pain, poor cosmetic result, poor wound healing, vascular damage, tendon damage and retained foreign body Anesthesia (see MAR for exact dosages):    Anesthesia method:  Local infiltration   Local anesthetic:  Lidocaine 1% WITH epi Laceration details:    Location: Left forearm.   Length (cm):  4   Depth (mm):  2 Repair type:    Repair type:  Simple Pre-procedure details:    Preparation:  Patient was prepped and draped in usual sterile fashion Exploration:    Hemostasis achieved with:  Direct  pressure and epinephrine   Wound exploration: wound explored through full range of motion and entire depth of wound probed and visualized   Treatment:    Area cleansed with:  Saline   Amount of cleaning:  Standard Skin repair:    Repair method:  Sutures   Suture size:  4-0   Suture material:  Nylon   Suture technique:  Simple interrupted   Number of sutures:  7 Approximation:    Approximation:  Close Post-procedure details:    Dressing:  Sterile dressing   Patient tolerance of procedure:  Tolerated well, no immediate complications .Critical Care Performed by: Maudie Flakes, MD Authorized by: Maudie Flakes, MD   Critical care provider statement:    Critical care time (minutes):  35   Critical care was necessary to treat or prevent imminent or life-threatening deterioration of the following conditions: Suicidal intent.   Critical care was time spent personally by me on the following activities:  Discussions with consultants, evaluation of patient's response to treatment, examination of patient, ordering and performing treatments and interventions, ordering and review of laboratory studies, ordering and review of radiographic studies, pulse oximetry, re-evaluation of patient's condition, obtaining history from patient or surrogate and review of old charts    ED Course and Medical Decision Making  I have reviewed the triage vital signs, the nursing notes, and pertinent available records from the EMR.  Listed above are laboratory and imaging tests that I personally ordered, reviewed, and interpreted and then considered in my medical decision making (see below for details).      Suicide attempt, hemostatic wounds, hemodynamically stable, will perform laceration repairs, obtain screening labs, consult TTS.  Will need IVC paperwork.  IVC paperwork is completed.  Patient's wounds repaired as described above.  EKG was abnormal with no prior, seems to be a strain pattern.  In response,  troponin was obtained and is reassuring.  Patient is now medically cleared.  There is some hemoconcentration on the laboratory results that should easily correct with  p.o. fluids.  Signed out to default provider.  Elmer Sow. Pilar Plate, MD [facility] Emergency Medicine [facility] [email or web address]  Final Clinical Impressions(s) / ED Diagnoses     ICD-10-CM   1. Suicide attempt (HCC)  T14.91XA   2. Laceration of left forearm, initial encounter  S51.812A   3. Laceration of right forearm, initial encounter  S51.811A   4. Laceration of right lower extremity, initial encounter  S81.811A     ED Discharge Orders    None       Discharge Instructions Discussed with and Provided to Patient:   Discharge Instructions   None       Sabas Sous, MD 07/14/19 2135

## 2019-07-14 NOTE — ED Notes (Addendum)
AC and CN notified about need for sitter  [employer] still at bedside

## 2019-07-14 NOTE — ED Notes (Signed)
Secretary to start [employer]

## 2019-07-14 NOTE — ED Notes (Signed)
Per previous RN, pt's belongings in locker 13, one item with security.

## 2019-07-14 NOTE — ED Notes (Signed)
Pt transported to Xray via stretcher

## 2019-07-14 NOTE — ED Notes (Signed)
Security wand patient

## 2019-07-14 NOTE — ED Triage Notes (Signed)
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

## 2019-07-14 NOTE — ED Notes (Signed)
Pt provided with warm blanket & pillow for comfort. Sitter now at bedside.

## 2019-07-15 LAB — HEMOGLOBIN A1C
Hgb A1c MFr Bld: 9.2 % — ABNORMAL HIGH (ref 4.8–5.6)
Mean Plasma Glucose: 217.34 mg/dL

## 2019-07-15 LAB — CBG MONITORING, ED
Glucose-Capillary: 293 mg/dL — ABNORMAL HIGH (ref 70–99)
Glucose-Capillary: 251 mg/dL — ABNORMAL HIGH (ref 70–99)
Glucose-Capillary: 253 mg/dL — ABNORMAL HIGH (ref 70–99)

## 2019-07-15 MED ORDER — GEMFIBROZIL 600 MG PO TABS
600.0000 mg | ORAL_TABLET | Freq: Two times a day (BID) | ORAL | Status: DC
  Administered 2019-07-16 – 2019-07-17 (×4): 600 mg via ORAL
  Filled 2019-07-15 (×6): qty 1

## 2019-07-15 MED ORDER — DAPAGLIFLOZIN PROPANEDIOL 10 MG PO TABS
10.0000 mg | ORAL_TABLET | Freq: Every day | ORAL | Status: DC
  Administered 2019-07-15 – 2019-07-17 (×3): 10 mg via ORAL
  Filled 2019-07-15 (×5): qty 1

## 2019-07-15 MED ORDER — RIVAROXABAN 20 MG PO TABS
20.0000 mg | ORAL_TABLET | Freq: Every day | ORAL | Status: DC
  Administered 2019-07-15 – 2019-07-17 (×3): 20 mg via ORAL
  Filled 2019-07-15 (×4): qty 1

## 2019-07-15 MED ORDER — GABAPENTIN 600 MG PO TABS
600.0000 mg | ORAL_TABLET | Freq: Three times a day (TID) | ORAL | Status: DC
  Administered 2019-07-15 – 2019-07-17 (×8): 600 mg via ORAL
  Filled 2019-07-15 (×8): qty 1

## 2019-07-15 MED ORDER — INSULIN ASPART 100 UNIT/ML ~~LOC~~ SOLN: 4.0000 [IU] | Freq: Three times a day (TID) | SUBCUTANEOUS | Status: DC

## 2019-07-15 MED ORDER — PRAZOSIN HCL 2 MG PO CAPS
2.0000 mg | ORAL_CAPSULE | Freq: Every day | ORAL | Status: DC
  Administered 2019-07-15 – 2019-07-17 (×3): 2 mg via ORAL
  Filled 2019-07-15 (×3): qty 1

## 2019-07-15 MED ORDER — METHOCARBAMOL 500 MG PO TABS
750.0000 mg | ORAL_TABLET | Freq: Four times a day (QID) | ORAL | Status: DC | PRN
  Administered 2019-07-15 – 2019-07-16 (×3): 750 mg via ORAL
  Filled 2019-07-15 (×3): qty 2

## 2019-07-15 MED ORDER — INSULIN ASPART 100 UNIT/ML ~~LOC~~ SOLN
0.0000 [IU] | Freq: Three times a day (TID) | SUBCUTANEOUS | Status: DC
  Administered 2019-07-15 – 2019-07-16 (×2): 8 [IU] via SUBCUTANEOUS
  Administered 2019-07-16: 3 [IU] via SUBCUTANEOUS
  Administered 2019-07-16: 11 [IU] via SUBCUTANEOUS
  Administered 2019-07-17 (×2): 5 [IU] via SUBCUTANEOUS

## 2019-07-15 MED ORDER — NIFEDIPINE ER OSMOTIC RELEASE 60 MG PO TB24
60.0000 mg | ORAL_TABLET | Freq: Every day | ORAL | Status: DC
  Administered 2019-07-15 – 2019-07-17 (×3): 60 mg via ORAL
  Filled 2019-07-15 (×4): qty 1

## 2019-07-15 MED ORDER — ACETAMINOPHEN 500 MG PO TABS
500.0000 mg | ORAL_TABLET | Freq: Three times a day (TID) | ORAL | Status: DC | PRN
  Administered 2019-07-15 – 2019-07-16 (×2): 500 mg via ORAL
  Filled 2019-07-15 (×2): qty 1

## 2019-07-15 MED ORDER — INSULIN GLARGINE 100 UNIT/ML ~~LOC~~ SOLN
35.0000 [IU] | Freq: Two times a day (BID) | SUBCUTANEOUS | Status: DC
  Administered 2019-07-15 – 2019-07-17 (×6): 35 [IU] via SUBCUTANEOUS
  Filled 2019-07-15 (×9): qty 0.35

## 2019-07-15 MED ORDER — DULAGLUTIDE 1.5 MG/0.5ML ~~LOC~~ SOAJ: 1.5000 mg | SUBCUTANEOUS | Status: DC

## 2019-07-15 MED ORDER — LIDOCAINE-EPINEPHRINE (PF) 2 %-1:200000 IJ SOLN
10.0000 mL | Freq: Once | INTRAMUSCULAR | Status: AC
  Administered 2019-07-15: 10 mL

## 2019-07-15 MED ORDER — QUETIAPINE FUMARATE 200 MG PO TABS
200.0000 mg | ORAL_TABLET | Freq: Every day | ORAL | Status: DC
  Administered 2019-07-15 – 2019-07-17 (×3): 200 mg via ORAL
  Filled 2019-07-15 (×3): qty 1

## 2019-07-15 MED ORDER — HYDROCHLOROTHIAZIDE 12.5 MG PO CAPS
12.5000 mg | ORAL_CAPSULE | Freq: Every day | ORAL | Status: DC
  Administered 2019-07-15 – 2019-07-17 (×3): 12.5 mg via ORAL
  Filled 2019-07-15 (×3): qty 1

## 2019-07-15 MED ORDER — LORATADINE 10 MG PO TABS
10.0000 mg | ORAL_TABLET | Freq: Every day | ORAL | Status: DC
  Administered 2019-07-15 – 2019-07-17 (×3): 10 mg via ORAL
  Filled 2019-07-15 (×4): qty 1

## 2019-07-15 MED ORDER — LOSARTAN POTASSIUM 50 MG PO TABS
100.0000 mg | ORAL_TABLET | Freq: Every day | ORAL | Status: DC
  Administered 2019-07-15 – 2019-07-17 (×3): 100 mg via ORAL
  Filled 2019-07-15 (×3): qty 2

## 2019-07-15 MED ORDER — DULOXETINE HCL 60 MG PO CPEP
60.0000 mg | ORAL_CAPSULE | Freq: Every day | ORAL | Status: DC
  Administered 2019-07-15 – 2019-07-17 (×3): 60 mg via ORAL
  Filled 2019-07-15 (×3): qty 1

## 2019-07-15 MED ORDER — NICOTINE 21 MG/24HR TD PT24
21.0000 mg | MEDICATED_PATCH | Freq: Every day | TRANSDERMAL | Status: DC
  Administered 2019-07-15 – 2019-07-17 (×3): 21 mg via TRANSDERMAL
  Filled 2019-07-15 (×4): qty 1

## 2019-07-15 MED ORDER — LINAGLIPTIN 5 MG PO TABS
5.0000 mg | ORAL_TABLET | Freq: Every day | ORAL | Status: DC
  Administered 2019-07-15 – 2019-07-17 (×3): 5 mg via ORAL
  Filled 2019-07-15 (×4): qty 1

## 2019-07-15 MED ORDER — GLIPIZIDE 10 MG PO TABS
10.0000 mg | ORAL_TABLET | Freq: Two times a day (BID) | ORAL | Status: DC
  Administered 2019-07-16 – 2019-07-17 (×4): 10 mg via ORAL
  Filled 2019-07-15 (×6): qty 1

## 2019-07-15 MED ORDER — LORAZEPAM 1 MG PO TABS
1.0000 mg | ORAL_TABLET | Freq: Three times a day (TID) | ORAL | Status: DC | PRN
  Administered 2019-07-15 – 2019-07-18 (×6): 1 mg via ORAL
  Filled 2019-07-15 (×6): qty 1

## 2019-07-15 MED ORDER — LIDOCAINE-EPINEPHRINE 1 %-1:100000 IJ SOLN
20.0000 mL | Freq: Once | INTRAMUSCULAR | Status: DC
  Filled 2019-07-15: qty 20

## 2019-07-15 MED ORDER — METOPROLOL SUCCINATE ER 25 MG PO TB24
25.0000 mg | ORAL_TABLET | Freq: Every day | ORAL | Status: DC
  Administered 2019-07-15 – 2019-07-17 (×3): 25 mg via ORAL
  Filled 2019-07-15 (×3): qty 1

## 2019-07-15 NOTE — ED Notes (Signed)
Pt requests that he has no visitors.  Family notified.  Father requested that staff call him and not pt's mom, dt the fact that the mother is emotionally exhausted.  Father is Yamir Carignan [phone], who flew up from [place] last night and will be in [place] until Monday.

## 2019-07-15 NOTE — Progress Notes (Signed)
Inpatient Diabetes Program Recommendations  AACE/ADA: New Consensus Statement on Inpatient Glycemic Control (2015)  Target Ranges:  Prepandial:   less than 140 mg/dL      Peak postprandial:   less than 180 mg/dL (1-2 hours)      Critically ill patients:  140 - 180 mg/dL   Lab Results  Component Value Date   GLUCAP 293 (H) 07/15/2019    Review of Glycemic Control Results for FRIEND, DORFMAN (MRN [id number]) as of 07/15/2019 11:40  Ref. Range 07/14/2019 17:14 07/15/2019 10:02  Glucose-Capillary Latest Ref Range: 70 - 99 mg/dL 859 (H) 292 (H)   Diabetes history: DM 2 Outpatient Diabetes medications:  Lantus 35 units bid, Trulicity 1.5 mg weekly, Glucotrol 10 mg bid, Novolog 35 units bid, Metformin 500 mg bid, Januvia 100 mg daily Current orders for Inpatient glycemic control:  Lantus 35 units bid, Novolog 4-16 units tid with meals Glipizide 10 mg bid  Inpatient Diabetes Program Recommendations:    Please change Novolog correction to moderate tid with meals and HS.  Also please add Novolog meal coverage 4 units tid with meals (hold if patient eats less than 50%).   Thanks,  Beryl Meager, RN, BC-ADM Inpatient Diabetes Coordinator Pager [phone] (8a-5p)

## 2019-07-15 NOTE — ED Notes (Signed)
Pt speaking on phone with family.  Becoming angry at family and frustrated with the interaction.  Stating when he leaves he's just going to "do it again" and make sure he does it right.

## 2019-07-15 NOTE — Progress Notes (Signed)
Pt meets inpatient criteria. Referral information has been sent to the following hospitals for review:  [facility]     [facility]  [facility]  [facility]         Disposition will continue to follow.   Wells Guiles, LCSW, LCAS Disposition CSW [facility]/TTS [phone]
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
## 2019-07-15 NOTE — ED Notes (Signed)
Breakfast ordered--Leslie  

## 2019-07-16 LAB — CBG MONITORING, ED
Glucose-Capillary: 285 mg/dL — ABNORMAL HIGH (ref 70–99)
Glucose-Capillary: 327 mg/dL — ABNORMAL HIGH (ref 70–99)
Glucose-Capillary: 164 mg/dL — ABNORMAL HIGH (ref 70–99)
Glucose-Capillary: 257 mg/dL — ABNORMAL HIGH (ref 70–99)

## 2019-07-16 MED ORDER — LORAZEPAM 1 MG PO TABS
2.0000 mg | ORAL_TABLET | Freq: Once | ORAL | Status: AC
  Administered 2019-07-16: 2 mg via ORAL
  Filled 2019-07-16: qty 2

## 2019-07-16 NOTE — ED Notes (Signed)
Pt resting at this time.  No complaints.  Sitter at bedside

## 2019-07-16 NOTE — ED Notes (Signed)
Ordered breakfast--Harold Garcia 

## 2019-07-16 NOTE — BH Assessment (Signed)
[facility] Assessment Progress Note This Probation officer met with patient earlier this date to assess current mental health status. Patient denies any S/I, H/I or AVH. Patient did use a scalpel to inflict self harm prior to arrival on 07/14/19 while at [employer]. Patient was here in [place] from [place] participating in a recovery program  and when told he would not be eligible to stay longer because he "lacked commitment" he attempted to take his life by cutting his wrists and leg. Patient had on arrival noted lacerations to his bilateral wrists and left leg. Patient's family is coming from [place] (where patient lives) to assist him when discharged to relocate to the [place] area. Per TTS assessment note patient's parents were to arrive on 07/15/19 although there are no notes to reflect their arrival at this time. Case was staffed with Bobby Rumpf NP who recommends continued inpatient care at this time.

## 2019-07-16 NOTE — ED Notes (Signed)
Pt observed standing on bed and reported he wanted to grab the over head light and swing out the door.  Pt stands in door opening of his room  Looking out to lobby when door opens.

## 2019-07-16 NOTE — ED Notes (Signed)
Lunch Tray Ordered @ 1032. 

## 2019-07-16 NOTE — ED Notes (Signed)
Dinner Ordered 

## 2019-07-17 LAB — CBG MONITORING, ED
Glucose-Capillary: 219 mg/dL — ABNORMAL HIGH (ref 70–99)
Glucose-Capillary: 274 mg/dL — ABNORMAL HIGH (ref 70–99)
Glucose-Capillary: 238 mg/dL — ABNORMAL HIGH (ref 70–99)
Glucose-Capillary: 220 mg/dL — ABNORMAL HIGH (ref 70–99)

## 2019-07-17 NOTE — Progress Notes (Signed)
Per Tiffany in admissions, pt has been accepted to [facility] for admission to [facility]; Accepting MD: Dr. Mordecai Maes. Number for report: [phone]. Becky RN is aware of above disposition. Pt will be can be transported tomorrow, Monday, 5/31. Becky RN and Tiffany in admission are aware of admission tomorrow 5/31.  Edlin Ford S. Alan Ripper, MSW, LCSW Clinical Social Worker 07/17/2019 3:01 PM

## 2019-07-17 NOTE — ED Notes (Signed)
Left message for [facility] Deputy notifying of need for transport on 07/18/19 to [facility] [employer]. Someone from his office should be calling in AM to notify of time of transport.

## 2019-07-17 NOTE — ED Provider Notes (Signed)
Pt chart, vitals, and RN notes reviewed.   In brief, pt presenting after cutting his wrists with a scalpel. He is under IVC at this time, pending placement at inpatient facility. cbg stable, HR improving.   Alveria Apley, PA-C 07/17/19 [id number]    Tilden Fossa, MD 07/17/19 [phone]

## 2019-07-17 NOTE — ED Notes (Signed)
Lunch Tray Ordered @ 1104.  

## 2019-07-17 NOTE — BH Assessment (Signed)
[facility] Assessment Progress Note    Patient was seen for re-assessment.  He denies current SI/HI/Psychosis.  Patient states that he regrets cutting himself and states that it was an impulsive act.  Patient states that he does not feel like he needs to be in a mental health hospital.  He states that he would like to go home to [place].  Informed patient that I would need to get collateral information from his parents before a decision to discharge him could be made.  TTS spoke to patient's mother, Lancelot Alyea [phone], for collateral information.  Mother states that patient has a history of cutting and extreme highs and lows with his depression.  She states that he has previous suicide attempts and she states that he talks about death by suicide.  She states that she is concerned that patient will hurt himself because he is very impulsive and has minimal coping strategies.  She does not feel like he can safely leave the hospital.  TTS will continue to seek placement for patient.

## 2019-07-17 NOTE — ED Notes (Signed)
Dinner Tray Ordered @ 1655. 

## 2019-07-17 NOTE — Progress Notes (Signed)
Pt continues to meet inpatient criteria. Referral re-faxed to the following facilities, as there is no bed availability at [facility] currently:  [facility] S. Alan Ripper, MSW, LCSW Clinical Social Worker 07/17/2019 1:56 PM

## 2019-07-17 NOTE — ED Notes (Signed)
SI IVC  Breakfast Ordered

## 2019-07-17 NOTE — ED Notes (Signed)
Phone privileges x 1

## 2019-07-18 NOTE — ED Notes (Signed)
Attempted to call report to Alvia Grove

## 2019-07-18 NOTE — ED Notes (Signed)
Attempted to call report to Alvia Grove again

## 2019-07-18 NOTE — ED Notes (Signed)
Attempted to call report to Alvia Grove [phone]

## 2019-07-18 NOTE — ED Notes (Signed)
Report given to Bonita Quin, Charity fundraiser at [facility] in [place], [place] (admission to [facility]). Pt en route to facility with [facility] department. Notified deputy report was given.

## 2019-07-18 NOTE — ED Notes (Signed)
Attempted to call report to Brynn Marr 

## 2019-07-18 NOTE — ED Notes (Signed)
Attempted to call report to Alvia Grove to give report

## 2021-10-05 IMAGING — DX DG FOREARM 2V*R*
2 series · 2 of 2 positions shown · non-contrast
Comparison: None.

CLINICAL DATA: Question foreign body.  Laceration with scalpel.

EXAM:
RIGHT FOREARM - 2 VIEW

[forearm ap]
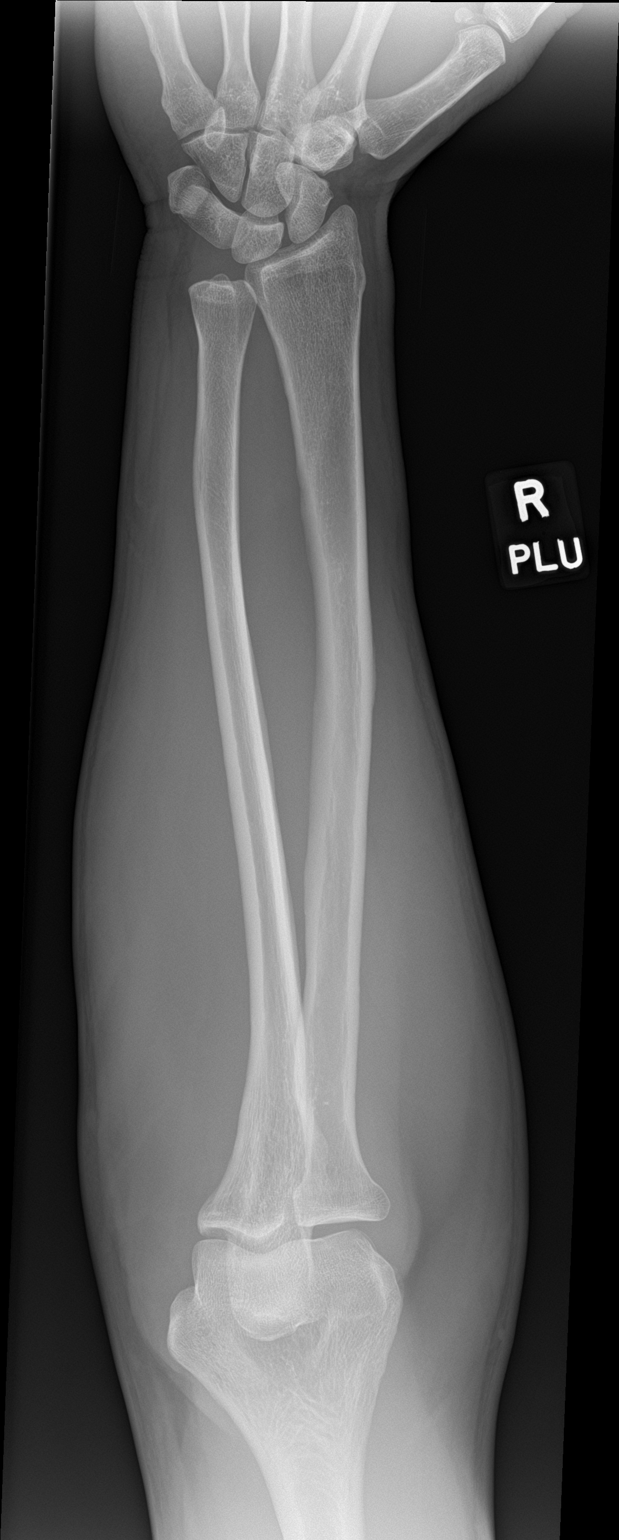

[forearm lat]
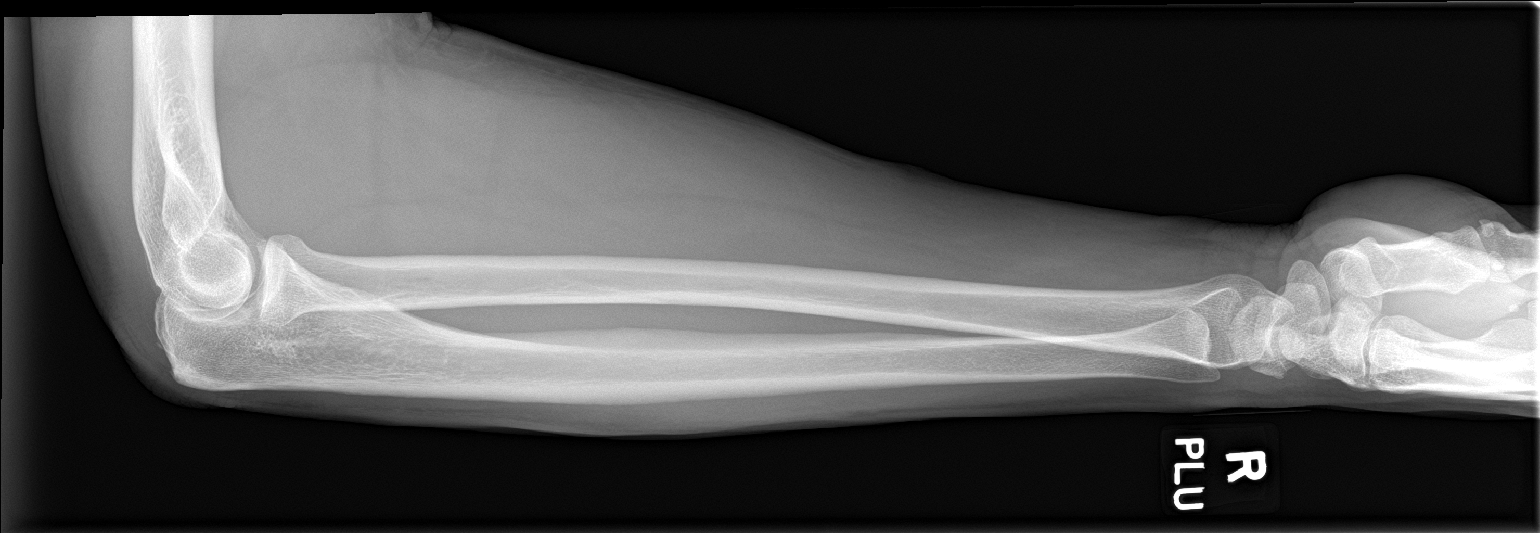

[2 of 2 positions shown; findings below may reference images not displayed]

FINDINGS: Cortical margins of the radius and ulna are intact. There is no
evidence of fracture or other focal bone lesions. Soft tissue edema
about the distal forearm. No radiopaque foreign body.
IMPRESSION: No fracture or radiopaque foreign body.

## 2021-10-05 IMAGING — DX DG FOREARM 2V*L*
2 series · 2 of 2 positions shown · non-contrast
Comparison: None.

CLINICAL DATA: Question foreign body.  Laceration with scalpel.

EXAM:
LEFT FOREARM - 2 VIEW

[forearm ap]
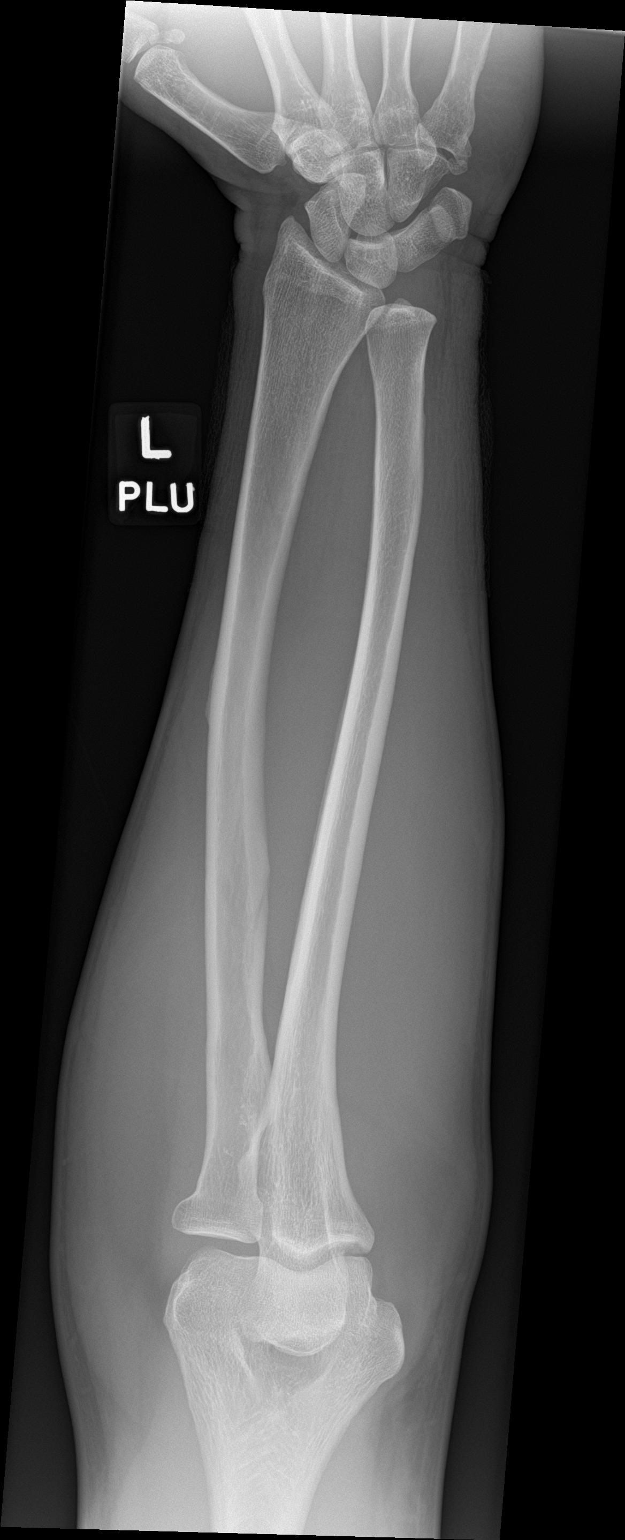

[forearm lat]
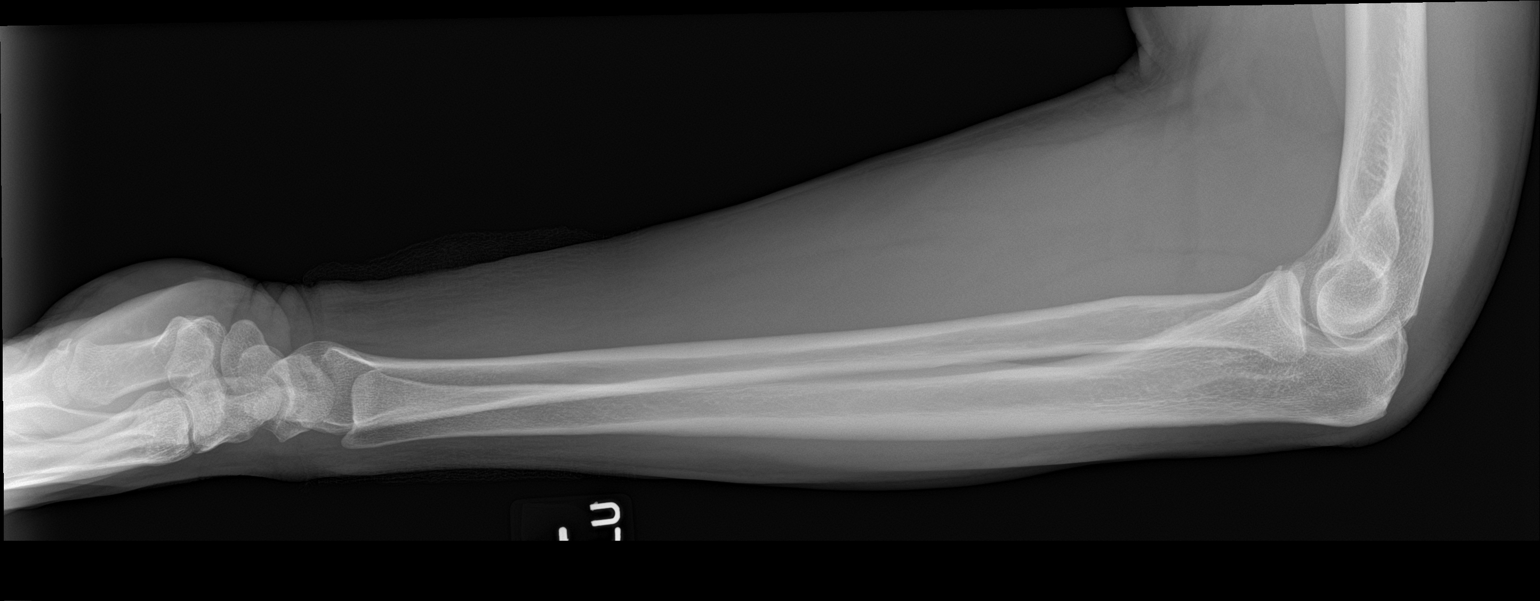

[2 of 2 positions shown; findings below may reference images not displayed]

FINDINGS: Cortical margins of the radius and ulna are intact. There is no
evidence of fracture or other focal bone lesions. Soft tissue edema
distally. No radiopaque foreign body.
IMPRESSION: No fracture or radiopaque foreign body.
